# Patient Record
Sex: Female | Born: 1980 | Race: White | Hispanic: No | Marital: Single | Smoking: Current every day smoker
Health system: Southern US, Community
[De-identification: ages and names within clinical notes are randomized; demographics above are authoritative.]

## PROBLEM LIST (undated history)

## (undated) DIAGNOSIS — G40909 Epilepsy, unspecified, not intractable, without status epilepticus: Secondary | ICD-10-CM

## (undated) DIAGNOSIS — F3112 Bipolar disorder, current episode manic without psychotic features, moderate: Secondary | ICD-10-CM

## (undated) DIAGNOSIS — F419 Anxiety disorder, unspecified: Secondary | ICD-10-CM

## (undated) DIAGNOSIS — H4710 Unspecified papilledema: Secondary | ICD-10-CM

## (undated) DIAGNOSIS — C55 Malignant neoplasm of uterus, part unspecified: Secondary | ICD-10-CM

## (undated) DIAGNOSIS — G5600 Carpal tunnel syndrome, unspecified upper limb: Secondary | ICD-10-CM

## (undated) HISTORY — PX: HAND SURGERY: SHX662

## (undated) HISTORY — PX: CHOLECYSTECTOMY: SHX55

## (undated) HISTORY — PX: ABDOMINAL HYSTERECTOMY: SHX81

## (undated) HISTORY — PX: HERNIA REPAIR: SHX51

---

## 2014-01-25 ENCOUNTER — Emergency Department: Payer: Self-pay | Admitting: Emergency Medicine

## 2014-01-25 LAB — URINALYSIS, COMPLETE
Bilirubin,UR: NEGATIVE
Blood: NEGATIVE
Glucose,UR: NEGATIVE mg/dL (ref 0–75)
Ketone: NEGATIVE
Leukocyte Esterase: NEGATIVE
NITRITE: NEGATIVE
PH: 7 (ref 4.5–8.0)
PROTEIN: NEGATIVE
RBC,UR: 3 /HPF (ref 0–5)
SPECIFIC GRAVITY: 1.02 (ref 1.003–1.030)
Squamous Epithelial: <1
WBC UR: 1 /HPF (ref 0–5)

## 2014-01-25 LAB — CBC
HCT: 40.4 % (ref 35.0–47.0)
HGB: 13.6 g/dL (ref 12.0–16.0)
MCH: 29.1 pg (ref 26.0–34.0)
MCHC: 33.7 g/dL (ref 32.0–36.0)
MCV: 86 fL (ref 80–100)
PLATELETS: 321 10*3/uL (ref 150–440)
RBC: 4.67 10*6/uL (ref 3.80–5.20)
RDW: 14.2 % (ref 11.5–14.5)
WBC: 7.6 10*3/uL (ref 3.6–11.0)

## 2014-01-25 LAB — COMPREHENSIVE METABOLIC PANEL
ALBUMIN: 3.4 g/dL (ref 3.4–5.0)
ALT: 41 U/L
ANION GAP: 6 — AB (ref 7–16)
Alkaline Phosphatase: 61 U/L
BUN: 13 mg/dL (ref 7–18)
Bilirubin,Total: 0.2 mg/dL (ref 0.2–1.0)
CHLORIDE: 107 mmol/L (ref 98–107)
CO2: 29 mmol/L (ref 21–32)
Calcium, Total: 8 mg/dL — ABNORMAL LOW (ref 8.5–10.1)
Creatinine: 0.86 mg/dL (ref 0.60–1.30)
Glucose: 104 mg/dL — ABNORMAL HIGH (ref 65–99)
OSMOLALITY: 284 (ref 275–301)
POTASSIUM: 3.7 mmol/L (ref 3.5–5.1)
SGOT(AST): 30 U/L (ref 15–37)
SODIUM: 142 mmol/L (ref 136–145)
TOTAL PROTEIN: 6.7 g/dL (ref 6.4–8.2)

## 2014-03-03 ENCOUNTER — Ambulatory Visit: Payer: Self-pay | Admitting: Ophthalmology

## 2014-03-19 ENCOUNTER — Ambulatory Visit: Payer: Self-pay | Admitting: Neurology

## 2014-07-03 ENCOUNTER — Inpatient Hospital Stay
Admit: 2014-07-03 | Disposition: A | Discharge status: Home / Self Care | Payer: Self-pay | Attending: Psychiatry | Admitting: Psychiatry

## 2014-07-03 LAB — COMPREHENSIVE METABOLIC PANEL
ALBUMIN: 4.3 g/dL
ALK PHOS: 61 U/L
ANION GAP: 5 — AB (ref 7–16)
BILIRUBIN TOTAL: 0.4 mg/dL
BUN: 9 mg/dL
CALCIUM: 8.8 mg/dL — AB
CHLORIDE: 106 mmol/L
Co2: 27 mmol/L
Creatinine: 0.71 mg/dL
EGFR (Non-African Amer.): >60
Glucose: 94 mg/dL
Potassium: 3.9 mmol/L
SGOT(AST): 24 U/L
SGPT (ALT): 29 U/L
Sodium: 138 mmol/L
TOTAL PROTEIN: 7.2 g/dL

## 2014-07-03 LAB — CBC
HCT: 42.6 % (ref 35.0–47.0)
HGB: 14.3 g/dL (ref 12.0–16.0)
MCH: 28.6 pg (ref 26.0–34.0)
MCHC: 33.7 g/dL (ref 32.0–36.0)
MCV: 85 fL (ref 80–100)
PLATELETS: 303 10*3/uL (ref 150–440)
RBC: 5.01 10*6/uL (ref 3.80–5.20)
RDW: 13.8 % (ref 11.5–14.5)
WBC: 8 10*3/uL (ref 3.6–11.0)

## 2014-07-03 LAB — URINALYSIS, COMPLETE
Bilirubin,UR: NEGATIVE
Glucose,UR: NEGATIVE mg/dL (ref 0–75)
KETONE: NEGATIVE
Leukocyte Esterase: NEGATIVE
Nitrite: NEGATIVE
PH: 7 (ref 4.5–8.0)
Protein: NEGATIVE
Specific Gravity: 1.005 (ref 1.003–1.030)

## 2014-07-03 LAB — DRUG SCREEN, URINE
Amphetamines, Ur Screen: NEGATIVE
Barbiturates, Ur Screen: NEGATIVE
Benzodiazepine, Ur Scrn: POSITIVE
COCAINE METABOLITE, UR ~~LOC~~: NEGATIVE
Cannabinoid 50 Ng, Ur ~~LOC~~: NEGATIVE
MDMA (ECSTASY) UR SCREEN: NEGATIVE
Methadone, Ur Screen: NEGATIVE
OPIATE, UR SCREEN: NEGATIVE
PHENCYCLIDINE (PCP) UR S: NEGATIVE
TRICYCLIC, UR SCREEN: NEGATIVE

## 2014-07-03 LAB — SALICYLATE LEVEL: Salicylates, Serum: <4 mg/dL

## 2014-07-03 LAB — ETHANOL: Ethanol: <5 mg/dL

## 2014-07-03 LAB — ACETAMINOPHEN LEVEL

## 2014-07-03 LAB — TSH: Thyroid Stimulating Horm: 2.421 u[IU]/mL
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
## 2015-08-02 ENCOUNTER — Emergency Department: Payer: Medicaid Other

## 2015-08-02 ENCOUNTER — Emergency Department
Admission: EM | Admit: 2015-08-02 | Discharge: 2015-08-02 | Disposition: A | Discharge status: Home / Self Care | Payer: Medicaid Other | Attending: Emergency Medicine | Admitting: Emergency Medicine

## 2015-08-02 DIAGNOSIS — M545 Low back pain: Secondary | ICD-10-CM | POA: Insufficient documentation

## 2015-08-02 DIAGNOSIS — S300XXA Contusion of lower back and pelvis, initial encounter: Secondary | ICD-10-CM | POA: Diagnosis not present

## 2015-08-02 DIAGNOSIS — Y999 Unspecified external cause status: Secondary | ICD-10-CM | POA: Insufficient documentation

## 2015-08-02 DIAGNOSIS — Y929 Unspecified place or not applicable: Secondary | ICD-10-CM | POA: Diagnosis not present

## 2015-08-02 DIAGNOSIS — W108XXA Fall (on) (from) other stairs and steps, initial encounter: Secondary | ICD-10-CM | POA: Diagnosis not present

## 2015-08-02 DIAGNOSIS — Z5321 Procedure and treatment not carried out due to patient leaving prior to being seen by health care provider: Secondary | ICD-10-CM | POA: Insufficient documentation

## 2015-08-02 DIAGNOSIS — Y939 Activity, unspecified: Secondary | ICD-10-CM | POA: Insufficient documentation

## 2015-08-02 MED ORDER — CYCLOBENZAPRINE HCL 10 MG PO TABS: 10.0000 mg | ORAL_TABLET | Freq: Three times a day (TID) | ORAL | Status: DC | PRN

## 2015-08-02 MED ORDER — OXYCODONE-ACETAMINOPHEN 5-325 MG PO TABS
1.0000 | ORAL_TABLET | Freq: Once | ORAL | Status: AC
Start: 2015-08-02 — End: 2015-08-02
  Administered 2015-08-02: 1 via ORAL
  Filled 2015-08-02: qty 1

## 2015-08-02 MED ORDER — IBUPROFEN 800 MG PO TABS: 800.0000 mg | ORAL_TABLET | Freq: Three times a day (TID) | ORAL | Status: DC | PRN

## 2015-08-02 MED ORDER — DIAZEPAM 5 MG/ML IJ SOLN
5.0000 mg | Freq: Once | INTRAMUSCULAR | Status: AC
  Administered 2015-08-02: 5 mg via INTRAMUSCULAR
  Filled 2015-08-02: qty 2

## 2015-08-02 MED ORDER — KETOROLAC TROMETHAMINE 60 MG/2ML IM SOLN
60.0000 mg | Freq: Once | INTRAMUSCULAR | Status: AC
  Administered 2015-08-02: 60 mg via INTRAMUSCULAR
  Filled 2015-08-02: qty 2

## 2015-08-02 MED ORDER — OXYCODONE-ACETAMINOPHEN 5-325 MG PO TABS
1.0000 | ORAL_TABLET | Freq: Once | ORAL | Status: AC
  Administered 2015-08-02: 1 via ORAL

## 2015-08-02 MED ORDER — OXYCODONE-ACETAMINOPHEN 5-325 MG PO TABS
ORAL_TABLET | ORAL | Status: AC
  Filled 2015-08-02: qty 1

## 2015-08-02 NOTE — ED Provider Notes (Signed)
[facility] Emergency Department Provider Note  ____________________________________________  Time seen: Approximately 10:23 AM  I have reviewed the triage vital signs and the nursing notes.   HISTORY  Chief Complaint Back Pain    HPI Paige Carr is a 35 y.o. female who presents for evaluation of low back pain. Patient states that she was her dog out when she slipped on a stone step and landed on her back. Complains of pain in her lower back nonradiating. About 8/10 at this time. Not taking medications over-the-counter. Patient unable to get comfortable. Head to have her neighbor bring her in. Planes of severe pain to her coccyx and tailbone as well as left hip.   No past medical history on file.  There are no active problems to display for this patient.   No past surgical history on file.  Current Outpatient Rx  Name  Route  Sig  Dispense  Refill  . cyclobenzaprine (FLEXERIL) 10 MG tablet   Oral   Take 1 tablet (10 mg total) by mouth every 8 (eight) hours as needed for muscle spasms.   30 tablet   1   . ibuprofen (ADVIL,MOTRIN) 800 MG tablet   Oral   Take 1 tablet (800 mg total) by mouth every 8 (eight) hours as needed.   30 tablet   0     Allergies Review of patient's allergies indicates no known allergies.  No family history on file.  Social History Social History  Substance Use Topics  . Smoking status: Not on file  . Smokeless tobacco: Not on file  . Alcohol Use: Not on file    Review of Systems Constitutional: No fever/chills Eyes: No visual changes. ENT: No sore throat. Cardiovascular: Denies chest pain. Respiratory: Denies shortness of breath. Gastrointestinal: No abdominal pain.  No nausea, no vomiting.  No diarrhea.  No constipation. Genitourinary: Negative for dysuria. Musculoskeletal: Positive for coccyx, sacrum, lumbar spine tenderness, positive for left hip tenderness. Skin: Negative for rash. Neurological:  Negative for headaches, focal weakness or numbness.  10-point ROS otherwise negative.  ____________________________________________   PHYSICAL EXAM:   BP 122/68 mmHg  Pulse 92  Resp 17  SpO2 100%   ED Triage Vitals  Enc Vitals Group     BP --      Pulse --      Resp --      Temp --      Temp src --      SpO2 --      Weight --      Height --      Head Cir --      Peak Flow --      Pain Score --      Pain Loc --      Pain Edu? --      Excl. in [place]? --     Constitutional: Alert and oriented. Well appearing and in Mild distress. Patient is laying flat on her stomach unable to rotate or turn. Exam performed while the patient is in a prone position. Cardiovascular: Normal rate, regular rhythm. Grossly normal heart sounds.  Good peripheral circulation. Respiratory: Normal respiratory effort.  No retractions. Lungs CTAB. Musculoskeletal: Examination limited to his lumbar coccyx sacrum tenderness point tenderness no ecchymosis or bruising noted. Positive for left hip and pelvis tenderness. Neurologic:  Normal speech and language. No gross focal neurologic deficits are appreciated. No gait instability. Skin:  Skin is warm, dry and intact. No rash noted. Psychiatric: Mood and  affect are normal. Speech and behavior are normal.  ____________________________________________   LABS (all labs ordered are listed, but only abnormal results are displayed)  Labs Reviewed - No data to display  RADIOLOGY   ____________________________________________   PROCEDURES  Procedure(s) performed: None  Critical Care performed: No  ____________________________________________   INITIAL IMPRESSION / ASSESSMENT AND PLAN / ED COURSE  Pertinent labs & imaging results that were available during my care of the patient were reviewed by me and considered in my medical decision making (see chart for details).  Status post fall with acute lumbar sacral contusion. Coccyx contusion and head  contusion. Reassurance provided to the patient Rx given for ibuprofen 800 and Flexeril 10 mg. Patient follow-up PCP or return to ER with any worsening symptomology. ____________________________________________   FINAL CLINICAL IMPRESSION(S) / ED DIAGNOSES  Final diagnoses:  Lumbar contusion, initial encounter  Contusion of sacrum, initial encounter     This chart was dictated using voice recognition software/Dragon. Despite best efforts to proofread, errors can occur which can change the meaning. Any change was purely unintentional.   Arlyss Repress, PA-C 08/02/15 [id number]  Lisa Roca, MD 08/02/15 [phone]

## 2015-08-02 NOTE — ED Notes (Signed)
See downtime note 

## 2015-08-02 NOTE — ED Notes (Signed)
Reports she was taking dogs out and slipped on a stone step and when she landed on her back she landed on the stone.  Patient complains of lower back pain.

## 2015-08-02 NOTE — Discharge Instructions (Signed)
Contusion A contusion is a deep bruise. Contusions are the result of a blunt injury to tissues and muscle fibers under the skin. The injury causes bleeding under the skin. The skin overlying the contusion may turn blue, purple, or yellow. Minor injuries will give you a painless contusion, but more severe contusions may stay painful and swollen for a few weeks.  CAUSES  This condition is usually caused by a blow, trauma, or direct force to an area of the body. SYMPTOMS  Symptoms of this condition include:  Swelling of the injured area.  Pain and tenderness in the injured area.  Discoloration. The area may have redness and then turn blue, purple, or yellow. DIAGNOSIS  This condition is diagnosed based on a physical exam and medical history. An X-ray, CT scan, or MRI may be needed to determine if there are any associated injuries, such as broken bones (fractures). TREATMENT  Specific treatment for this condition depends on what area of the body was injured. In general, the best treatment for a contusion is resting, icing, applying pressure to (compression), and elevating the injured area. This is often called the RICE strategy. Over-the-counter anti-inflammatory medicines may also be recommended for pain control.  HOME CARE INSTRUCTIONS   Rest the injured area.  If directed, apply ice to the injured area:  Put ice in a plastic bag.  Place a towel between your skin and the bag.  Leave the ice on for 20 minutes, 2-3 times per day.  If directed, apply light compression to the injured area using an elastic bandage. Make sure the bandage is not wrapped too tightly. Remove and reapply the bandage as directed by your health care provider.  If possible, raise (elevate) the injured area above the level of your heart while you are sitting or lying down.  Take over-the-counter and prescription medicines only as told by your health care provider. SEEK MEDICAL CARE IF:  Your symptoms do not  improve after several days of treatment.  Your symptoms get worse.  You have difficulty moving the injured area. SEEK IMMEDIATE MEDICAL CARE IF:   You have severe pain.  You have numbness in a hand or foot.  Your hand or foot turns pale or cold.   This information is not intended to replace advice given to you by your health care provider. Make sure you discuss any questions you have with your health care provider.   Document Released: 12/07/2004 Document Revised: 11/18/2014 Document Reviewed: 07/15/2014 Elsevier Interactive Patient Education 2016 [place] Injury The tailbone (coccyx) is the small bone at the lower end of the spine. A tailbone injury may involve stretched ligaments, bruising, or a broken bone (fracture). Tailbone injuries can be painful, and some may take a long time to heal. CAUSES This condition is often caused by falling and landing on the tailbone. Other causes include:  Repeated strain or friction from actions such as rowing and bicycling.  Childbirth. In some cases, the cause may not be known. RISK FACTORS This condition is more common in women than in men. SYMPTOMS Symptoms of this condition include:  Pain in the lower back, especially when sitting.  Pain or difficulty when standing up from a sitting position.  Bruising in the tailbone area.  Painful bowel movements.  In women, pain during intercourse. DIAGNOSIS This condition may be diagnosed based on your symptoms and a physical exam. X-rays may be taken if a fracture is suspected. You may also have other tests, such as  a CT scan or MRI. TREATMENT This condition may be treated with medicines to help relieve your pain. Most tailbone injuries heal on their own in 4-6 weeks. However, recovery time may be longer if the injury involves a fracture. HOME CARE INSTRUCTIONS  Take medicines only as directed by your health care provider.  If directed, apply ice to the injured  area:  Put ice in a plastic bag.  Place a towel between your skin and the bag.  Leave the ice on for 20 minutes, 2-3 times per day for the first 1-2 days.  Sit on a large, rubber or inflated ring or cushion to ease your pain. Lean forward when you are sitting to help decrease discomfort.  Avoid sitting for long periods of time.  Increase your activity as the pain allows. Perform any exercises that are recommended by your health care provider or physical therapist.  If you have pain during bowel movements, use stool softeners as directed by your health care provider.  Eat a diet that includes plenty of fiber to help prevent constipation.  Keep all follow-up visits as directed by your health care provider. This is important. PREVENTION Wear appropriate padding and sports gear when bicycling and rowing. This can help to prevent developing an injury that is caused by repeated strain or friction. SEEK MEDICAL CARE IF:  Your pain becomes worse.  Your bowel movements cause a great deal of discomfort.  You are unable to have a bowel movement.  You have uncontrolled urine loss (urinary incontinence).  You have a fever.   This information is not intended to replace advice given to you by your health care provider. Make sure you discuss any questions you have with your health care provider.   Document Released: 02/25/2000 Document Revised: 07/14/2014 Document Reviewed: 02/23/2014 Elsevier Interactive Patient Education [employer].

## 2015-08-02 NOTE — ED Notes (Signed)
States she fell couple of days ago  States pain is worse this am   Unable to bear wt  States pain is radiating into both legs

## 2015-08-02 NOTE — ED Notes (Signed)
See  Down time triage note

## 2015-08-02 NOTE — ED Notes (Signed)
No answer when called for treatment room.  ?

## 2015-08-16 ENCOUNTER — Emergency Department
Admission: EM | Admit: 2015-08-16 | Discharge: 2015-08-17 | Disposition: A | Discharge status: Home / Self Care | Payer: Medicaid Other | Attending: Emergency Medicine | Admitting: Emergency Medicine

## 2015-08-16 ENCOUNTER — Encounter: Payer: Self-pay | Admitting: Emergency Medicine

## 2015-08-16 DIAGNOSIS — Z91040 Latex allergy status: Secondary | ICD-10-CM | POA: Diagnosis not present

## 2015-08-16 DIAGNOSIS — Z791 Long term (current) use of non-steroidal anti-inflammatories (NSAID): Secondary | ICD-10-CM | POA: Insufficient documentation

## 2015-08-16 DIAGNOSIS — R45851 Suicidal ideations: Secondary | ICD-10-CM

## 2015-08-16 DIAGNOSIS — F449 Dissociative and conversion disorder, unspecified: Secondary | ICD-10-CM

## 2015-08-16 DIAGNOSIS — F319 Bipolar disorder, unspecified: Secondary | ICD-10-CM

## 2015-08-16 DIAGNOSIS — F23 Brief psychotic disorder: Secondary | ICD-10-CM | POA: Diagnosis not present

## 2015-08-16 DIAGNOSIS — F3112 Bipolar disorder, current episode manic without psychotic features, moderate: Secondary | ICD-10-CM | POA: Insufficient documentation

## 2015-08-16 DIAGNOSIS — Z046 Encounter for general psychiatric examination, requested by authority: Secondary | ICD-10-CM

## 2015-08-16 DIAGNOSIS — F17200 Nicotine dependence, unspecified, uncomplicated: Secondary | ICD-10-CM | POA: Insufficient documentation

## 2015-08-16 HISTORY — DX: Bipolar disorder, current episode manic without psychotic features, moderate: F31.12

## 2015-08-16 LAB — COMPREHENSIVE METABOLIC PANEL
ALT: 12 U/L — AB (ref 14–54)
ANION GAP: 6 (ref 5–15)
AST: 16 U/L (ref 15–41)
Albumin: 4.1 g/dL (ref 3.5–5.0)
Alkaline Phosphatase: 45 U/L (ref 38–126)
BUN: 8 mg/dL (ref 6–20)
CHLORIDE: 108 mmol/L (ref 101–111)
CO2: 22 mmol/L (ref 22–32)
CREATININE: 0.7 mg/dL (ref 0.44–1.00)
Calcium: 8.5 mg/dL — ABNORMAL LOW (ref 8.9–10.3)
GFR calc non Af Amer: >60 mL/min (ref >60)
GLUCOSE: 92 mg/dL (ref 65–99)
Potassium: 3.6 mmol/L (ref 3.5–5.1)
SODIUM: 136 mmol/L (ref 135–145)
Total Bilirubin: 0.2 mg/dL — ABNORMAL LOW (ref 0.3–1.2)
Total Protein: 6.6 g/dL (ref 6.5–8.1)

## 2015-08-16 LAB — URINE DRUG SCREEN, QUALITATIVE (ARMC ONLY)
Amphetamines, Ur Screen: NOT DETECTED
Barbiturates, Ur Screen: NOT DETECTED
Benzodiazepine, Ur Scrn: POSITIVE — AB
COCAINE METABOLITE, UR ~~LOC~~: NOT DETECTED
Cannabinoid 50 Ng, Ur ~~LOC~~: NOT DETECTED
MDMA (ECSTASY) UR SCREEN: NOT DETECTED
Methadone Scn, Ur: NOT DETECTED
Opiate, Ur Screen: NOT DETECTED
Phencyclidine (PCP) Ur S: NOT DETECTED
TRICYCLIC, UR SCREEN: NOT DETECTED

## 2015-08-16 LAB — CBC
HCT: 39.2 % (ref 35.0–47.0)
HEMOGLOBIN: 13.3 g/dL (ref 12.0–16.0)
MCH: 28.5 pg (ref 26.0–34.0)
MCHC: 33.8 g/dL (ref 32.0–36.0)
MCV: 84.2 fL (ref 80.0–100.0)
Platelets: 262 10*3/uL (ref 150–440)
RBC: 4.66 MIL/uL (ref 3.80–5.20)
RDW: 14 % (ref 11.5–14.5)
WBC: 10.2 10*3/uL (ref 3.6–11.0)

## 2015-08-16 LAB — POCT PREGNANCY, URINE: Preg Test, Ur: NEGATIVE
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
## 2015-08-17 DIAGNOSIS — Z046 Encounter for general psychiatric examination, requested by authority: Secondary | ICD-10-CM

## 2015-08-17 DIAGNOSIS — F23 Brief psychotic disorder: Secondary | ICD-10-CM | POA: Diagnosis not present

## 2015-08-17 DIAGNOSIS — R45851 Suicidal ideations: Secondary | ICD-10-CM

## 2015-08-17 DIAGNOSIS — F319 Bipolar disorder, unspecified: Secondary | ICD-10-CM

## 2015-08-17 DIAGNOSIS — F449 Dissociative and conversion disorder, unspecified: Secondary | ICD-10-CM

## 2015-08-17 LAB — SALICYLATE LEVEL

## 2015-08-17 LAB — ACETAMINOPHEN LEVEL

## 2015-08-17 LAB — ETHANOL: Alcohol, Ethyl (B): <5 mg/dL (ref <5)

## 2015-08-17 NOTE — ED Notes (Signed)
ED BHU PLACEMENT JUSTIFICATION Is the patient under IVC or is there intent for IVC: Yes.   Is the patient medically cleared: Yes.   Is there vacancy in the ED BHU: Yes.   Is the population mix appropriate for patient: Yes.   Is the patient awaiting placement in inpatient or outpatient setting: No. Has the patient had a psychiatric consult: No. Survey of unit performed for contraband, proper placement and condition of furniture, tampering with fixtures in bathroom, shower, and each patient room: Yes.  ; Findings: NA APPEARANCE/BEHAVIOR calm, cooperative and adequate rapport can be established NEURO ASSESSMENT Orientation: time, place and person Hallucinations: No.None noted (Hallucinations) Speech: Normal Gait: normal RESPIRATORY ASSESSMENT Normal expansion.  Clear to auscultation.  No rales, rhonchi, or wheezing. CARDIOVASCULAR ASSESSMENT regular rate and rhythm, S1, S2 normal, no murmur, click, rub or gallop GASTROINTESTINAL ASSESSMENT soft, nontender, BS WNL, no r/g EXTREMITIES normal strength, tone, and muscle mass PLAN OF CARE Provide calm/safe environment. Vital signs assessed twice daily. ED BHU Assessment once each 12-hour shift. Collaborate with intake RN daily or as condition indicates. Assure the ED provider has rounded once each shift. Provide and encourage hygiene. Provide redirection as needed. Assess for escalating behavior; address immediately and inform ED provider.  Assess family dynamic and appropriateness for visitation as needed: Yes.  ; If necessary, describe findings: NA Educate the patient/family about BHU procedures/visitation: Yes.  ; If necessary, describe findings: NA  

## 2015-08-17 NOTE — ED Provider Notes (Signed)
[facility] Emergency Department Provider Note   ____________________________________________  Time seen: Approximately 2325 PM  I have reviewed the triage vital signs and the nursing notes.   HISTORY  Chief Complaint Suicidal    HPI Paige Carr is a 35 y.o. female who comes into the hospital today brought in under involuntary commitment with the police. According to the police they received a call for patient being suicidal. They report that when they arrived the husband was restraining the patient on the floor and she was kicking with the intention of escaping to get a knife to hurt herself. According to the police the patient was hysterical saying that she wanted to be her father who was in heaven. She did receive Haldol by EMS to help calm her down. The patient does have a history of bipolar disorder and according to family this episode is worse than it typically is. They do not know of any triggers but the patient says that this been a lot building up. The patient reports she takes her medicine on a regular basis but her husband is doubtful. The patient has a history of suicide attempts in the past with overdose being the most recent. The patient's father also committed suicide within the past 5 years. The patient reports that she does not remember what happened today and she is currently denying suicidal ideation. The patient reports she's had some auditory as [place] telling her that she is not good enough and that she should die. The patient is here for evaluation.   Past Medical History  Diagnosis Date  . Bipolar 1 disorder with moderate mania (HCC)     There are no active problems to display for this patient.   Past Surgical History  Procedure Laterality Date  . Cholecystectomy    . Hernia repair      Current Outpatient Rx  Name  Route  Sig  Dispense  Refill  . cyclobenzaprine (FLEXERIL) 10 MG tablet   Oral   Take 1 tablet (10 mg total) by  mouth every 8 (eight) hours as needed for muscle spasms.   30 tablet   1   . ibuprofen (ADVIL,MOTRIN) 800 MG tablet   Oral   Take 1 tablet (800 mg total) by mouth every 8 (eight) hours as needed.   30 tablet   0     Allergies Latex  No family history on file.  Social History Social History  Substance Use Topics  . Smoking status: Current Every Day Smoker  . Smokeless tobacco: None  . Alcohol Use: No    Review of Systems Constitutional: No fever/chills Eyes: No visual changes. ENT: No sore throat. Cardiovascular: Denies chest pain. Respiratory: Denies shortness of breath. Gastrointestinal: No abdominal pain.  No nausea, no vomiting.  No diarrhea.  No constipation. Genitourinary: Negative for dysuria. Musculoskeletal: Negative for back pain. Skin: Negative for rash. Neurological: Negative for headaches, focal weakness or numbness. Psych: Suicidal ideation  10-point ROS otherwise negative.  ____________________________________________   PHYSICAL EXAM:  VITAL SIGNS: ED Triage Vitals  Enc Vitals Group     BP 08/16/15 2324 108/64 mmHg     Pulse Rate 08/16/15 2324 90     Resp 08/16/15 2324 14     Temp 08/16/15 2324 98.1 F (36.7 C)     Temp Source 08/16/15 2324 Oral     SpO2 08/16/15 2324 95 %     Weight 08/16/15 2324 165 lb (74.844 kg)     Height 08/16/15  2324 5\' 3"  (1.6 m)     Head Cir --      Peak Flow --      Pain Score 08/16/15 2325 7     Pain Loc --      Pain Edu? --      Excl. in [place]? --     Constitutional: Alert and oriented. Well appearing and in no acute distress. Eyes: Conjunctivae are normal. PERRL. EOMI. Head: Atraumatic. Nose: No congestion/rhinnorhea. Mouth/Throat: Mucous membranes are moist.  Oropharynx non-erythematous. Cardiovascular: Normal rate, regular rhythm. Grossly normal heart sounds.  Good peripheral circulation. Respiratory: Normal respiratory effort.  No retractions. Lungs CTAB. Gastrointestinal: Soft and nontender. No  distention. Positive bowel sounds Musculoskeletal: No lower extremity tenderness nor edema.   Neurologic:  Normal speech and language.  Skin:  Skin is warm, dry and intact. Psychiatric: Mood and affect are normal.   ____________________________________________   LABS (all labs ordered are listed, but only abnormal results are displayed)  Labs Reviewed  COMPREHENSIVE METABOLIC PANEL - Abnormal; Notable for the following:    Calcium 8.5 (*)    ALT 12 (*)    Total Bilirubin 0.2 (*)    All other components within normal limits  ACETAMINOPHEN LEVEL - Abnormal; Notable for the following:    Acetaminophen (Tylenol), Serum <10 (*)    All other components within normal limits  URINE DRUG SCREEN, QUALITATIVE ([facility] ONLY) - Abnormal; Notable for the following:    Benzodiazepine, Ur Scrn POSITIVE (*)    All other components within normal limits  ETHANOL  SALICYLATE LEVEL  CBC  POC URINE PREG, ED  POCT PREGNANCY, URINE   ____________________________________________  EKG  None ____________________________________________  RADIOLOGY  None ____________________________________________   PROCEDURES  Procedure(s) performed: None  Critical Care performed: No  ____________________________________________   INITIAL IMPRESSION / ASSESSMENT AND PLAN / ED COURSE  Pertinent labs & imaging results that were available during my care of the patient were reviewed by me and considered in my medical decision making (see chart for details).  This is a 35 year old female who comes into the hospital today with suicidal ideation. The patient will be seen by psych. ____________________________________________   FINAL CLINICAL IMPRESSION(S) / ED DIAGNOSES  Final diagnoses:  Suicidal ideation      NEW MEDICATIONS STARTED DURING THIS VISIT:  New Prescriptions   No medications on file     Note:  This document was prepared using Dragon voice recognition software and may include  unintentional dictation errors.    Loney Hering, MD 08/17/15 [phone]

## 2015-08-17 NOTE — Consult Note (Signed)
[facility] Consult   Reason for Consult:  Consult for 35 year old woman brought in after becoming agitated and potentially suicidal at home. She was on IVC. Referring Physician:  Reita Cliche Patient Identification: Paige Carr MRN:  [id number] Principal Diagnosis: Brief reactive psychosis Diagnosis:   Patient Active Problem List   Diagnosis Date Noted  . 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 Bipolar disorder ([place]) [F31.9] 08/17/2015  . Suicidal ideation [R45.851] 08/17/2015  . Involuntary commitment [Z04.6] 08/17/2015    Total Time spent with patient: 1 hour  Subjective:   Paige Carr is a 35 y.o. female patient admitted with "I had a psychotic breakdown".  HPI:  Patient interviewed. Chart reviewed. Labs and vitals reviewed. 35 year old woman remembers last night going to bed. Some time after that apparently she got up and she has a vague memory of walking towards the kitchen with a thought of picking up a knife. Evidently she actually got a knife and was trying to hurt herself. Her husband restrained her. When she was picked up by police she was described as still being very agitated. Patient this morning on interview is completely calm. She does say that she has been under a lot of emotional stress recently. Most acutely she had a stressful situation, up yesterday. She has a brother who is chronically mentally ill who every now and then will come back into her life with manipulative behavior. Apparently that happened yesterday and there was a big upper or over it. Patient also feels like she gets a lot of stress from her family at home. Also it feels stressed out by raising 4 children at home. Baseline she feels like she does have some chronic anxiety. She says that she goes to therapy a couple times a week. He is currently on prescription psychiatric medicine and sees an outpatient psychiatrist. Patient does not describe what  sounds like a clear mania over the last few days. There were no waking psychotic symptoms. There was no waking abnormal behavior that she is describing. She says that last night because she was trying to get to sleep she took her Xanax and Percocet but she claims that this is a pretty typical thing for her to do to take the full dose of Xanax and Percocet at the same time. She denies that she was using any other drugs and denies that she was taking alcohol. Today the patient denies having any suicidal thoughts or wishes at all. Denies homicidal ideation. She is not having any current psychotic symptoms.  Social history: Lives with her husband. Therefore young children at home 2 of them hers to them his by previous relationship. Patient not working outside the home. Feels like it's an altogether stressful situation. Also stays in close contact with her mother and her brother who she says is chronically mentally ill and occasionally manipulative such as yesterday. Talking with him brings on a lot of guilt and emotional distress.  Medical history: Patient has a history of a seizure disorder. She is taking Keppra from her description of it. She says she is been on it for quite a while and she has not had a seizure that she knows of in 5 years. The seizure she does describe do not sound like grand mal seizures. No other ongoing medical problems.  Substance abuse history: Patient does not drink. Does not abuse drugs. Denies that she abuses her prescription medicine.    Past Psychiatric History: Patient has a history  of mood symptoms. She has been diagnosed as bipolar. She is currently taking Latuda 60 mg a day and takes Xanax. Sounds like the dose is a half milligram one to 3 of them a day as needed. 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 Sees a therapist here more locally on a regular basis. She does have a prior history of a suicide attempt overdose of Ambien in 2013.  Risk to Self: Suicidal Ideation: No Suicidal  Intent: No Is patient at risk for suicide?: No Suicidal Plan?: No Access to Means: No What has been your use of drugs/alcohol within the last 12 months?: denied use How many times?: 10 ("A lot of times") Other Self Harm Risks: denied Triggers for Past Attempts: Unpredictable Intentional Self Injurious Behavior: None Risk to Others: Homicidal Ideation: No Thoughts of Harm to Others: No Current Homicidal Intent: No Current Homicidal Plan: No Access to Homicidal Means: No Identified Victim: denied History of harm to others?: No Assessment of Violence: None Noted Violent Behavior Description: denied Does patient have access to weapons?: No Criminal Charges Pending?: No Does patient have a court date: No Prior Inpatient Therapy: Prior Inpatient Therapy: Yes Prior Therapy Dates: 2016 Prior Therapy Facilty/Provider(s): [facility] Reason for Treatment: Bipolar Disorder Prior Outpatient Therapy: Prior Outpatient Therapy: Yes Prior Therapy Dates: Current Prior Therapy Facilty/Provider(s): [facility] Reason for Treatment: Bipolar Disorder Does patient have an ACCT team?: No Does patient have Intensive In-House Services?  : No Does patient have [facility] services? : No Does patient have P4CC services?: No  Past Medical History:  Past Medical History  Diagnosis Date  . Bipolar 1 disorder with moderate mania ([place])     Past Surgical History  Procedure Laterality Date  . Cholecystectomy    . Hernia repair     Family History: No family history on file. Family Psychiatric  History: Brother is described as also having bipolar disorder sounds like a pretty difficult person. The patient's father committed suicide. Social History:  History  Alcohol Use No     History  Drug Use No    Social History   Social History  . Marital Status: Single    Spouse Name: N/A  . Number of Children: N/A  . Years of Education: N/A   Social History Main Topics  . Smoking status: Current Every Day Smoker   . Smokeless tobacco: None  . Alcohol Use: No  . Drug Use: No  . Sexual Activity: Not Asked   Other Topics Concern  . None   Social History Narrative  . None   Additional Social History:    Allergies:   Allergies  Allergen Reactions  . Latex Hives    Labs:  Results for orders placed or performed during the hospital encounter of 08/16/15 (from the past 48 hour(s))  Comprehensive metabolic panel     Status: Abnormal   Collection Time: 08/16/15 11:24 PM  Result Value Ref Range   Sodium 136 135 - 145 mmol/L   Potassium 3.6 3.5 - 5.1 mmol/L   Chloride 108 101 - 111 mmol/L   CO2 22 22 - 32 mmol/L   Glucose, Bld 92 65 - 99 mg/dL   BUN 8 6 - 20 mg/dL   Creatinine, Ser 0.70 0.44 - 1.00 mg/dL   Calcium 8.5 (L) 8.9 - 10.3 mg/dL   Total Protein 6.6 6.5 - 8.1 g/dL   Albumin 4.1 3.5 - 5.0 g/dL   AST 16 15 - 41 U/L   ALT 12 (L) 14 -  54 U/L   Alkaline Phosphatase 45 38 - 126 U/L   Total Bilirubin 0.2 (L) 0.3 - 1.2 mg/dL   GFR calc non Af Amer >60 >60 mL/min   GFR calc Af Amer >60 >60 mL/min    Comment: (NOTE) The eGFR has been calculated using the CKD EPI equation. This calculation has not been validated in all clinical situations. eGFR's persistently <60 mL/min signify possible Chronic Kidney Disease.    Anion gap 6 5 - 15  Ethanol     Status: None   Collection Time: 08/16/15 11:24 PM  Result Value Ref Range   Alcohol, Ethyl (B) <5 <5 mg/dL    Comment:        LOWEST DETECTABLE LIMIT FOR SERUM ALCOHOL IS 5 mg/dL FOR MEDICAL PURPOSES ONLY   Salicylate level     Status: None   Collection Time: 08/16/15 11:24 PM  Result Value Ref Range   Salicylate Lvl <7.0 2.8 - 30.0 mg/dL  Acetaminophen level     Status: Abnormal   Collection Time: 08/16/15 11:24 PM  Result Value Ref Range   Acetaminophen (Tylenol), Serum <10 (L) 10 - 30 ug/mL    Comment:        THERAPEUTIC CONCENTRATIONS VARY SIGNIFICANTLY. A RANGE OF 10-30 ug/mL MAY BE AN EFFECTIVE CONCENTRATION FOR MANY  PATIENTS. HOWEVER, SOME ARE BEST TREATED AT CONCENTRATIONS OUTSIDE THIS RANGE. ACETAMINOPHEN CONCENTRATIONS >150 ug/mL AT 4 HOURS AFTER INGESTION AND >50 ug/mL AT 12 HOURS AFTER INGESTION ARE OFTEN ASSOCIATED WITH TOXIC REACTIONS.   cbc     Status: None   Collection Time: 08/16/15 11:24 PM  Result Value Ref Range   WBC 10.2 3.6 - 11.0 K/uL   RBC 4.66 3.80 - 5.20 MIL/uL   Hemoglobin 13.3 12.0 - 16.0 g/dL   HCT 39.2 35.0 - 47.0 %   MCV 84.2 80.0 - 100.0 fL   MCH 28.5 26.0 - 34.0 pg   MCHC 33.8 32.0 - 36.0 g/dL   RDW 14.0 11.5 - 14.5 %   Platelets 262 150 - 440 K/uL  Urine Drug Screen, Qualitative     Status: Abnormal   Collection Time: 08/16/15 11:36 PM  Result Value Ref Range   Tricyclic, Ur Screen NONE DETECTED NONE DETECTED   Amphetamines, Ur Screen NONE DETECTED NONE DETECTED   MDMA (Ecstasy)Ur Screen NONE DETECTED NONE DETECTED   Cocaine Metabolite,Ur [place] NONE DETECTED NONE DETECTED   Opiate, Ur Screen NONE DETECTED NONE DETECTED   Phencyclidine (PCP) Ur S NONE DETECTED NONE DETECTED   Cannabinoid 50 Ng, Ur [place] NONE DETECTED NONE DETECTED   Barbiturates, Ur Screen NONE DETECTED NONE DETECTED   Benzodiazepine, Ur Scrn POSITIVE (A) NONE DETECTED   Methadone Scn, Ur NONE DETECTED NONE DETECTED    Comment: (NOTE) 350  Tricyclics, urine               Cutoff 1000 ng/mL 200  Amphetamines, urine             Cutoff 1000 ng/mL 300  MDMA (Ecstasy), urine           Cutoff 500 ng/mL 400  Cocaine Metabolite, urine       Cutoff 300 ng/mL 500  Opiate, urine                   Cutoff 300 ng/mL 600  Phencyclidine (PCP), urine      Cutoff 25 ng/mL 700  Cannabinoid, urine  Cutoff 50 ng/mL 800  Barbiturates, urine             Cutoff 200 ng/mL 900  Benzodiazepine, urine           Cutoff 200 ng/mL 1000 Methadone, urine                Cutoff 300 ng/mL 1100 1200 The urine drug screen provides only a preliminary, unconfirmed 1300 analytical test result and should not be used for  non-medical 1400 purposes. Clinical consideration and professional judgment should 1500 be applied to any positive drug screen result due to possible 1600 interfering substances. A more specific alternate chemical method 1700 must be used in order to obtain a confirmed analytical result.  1800 Gas chromato graphy / mass spectrometry (GC/MS) is the preferred 1900 confirmatory method.   Pregnancy, urine POC     Status: None   Collection Time: 08/16/15 11:38 PM  Result Value Ref Range   Preg Test, Ur NEGATIVE NEGATIVE    Comment:        THE SENSITIVITY OF THIS METHODOLOGY IS >24 mIU/mL     No current facility-administered medications for this encounter.   Current Outpatient Prescriptions  Medication Sig Dispense Refill  . cyclobenzaprine (FLEXERIL) 10 MG tablet Take 1 tablet (10 mg total) by mouth every 8 (eight) hours as needed for muscle spasms. 30 tablet 1  . ibuprofen (ADVIL,MOTRIN) 800 MG tablet Take 1 tablet (800 mg total) by mouth every 8 (eight) hours as needed. 30 tablet 0    Musculoskeletal: Strength & Muscle Tone: within normal limits Gait & Station: normal Patient leans: N/A  Psychiatric Specialty Exam: Physical Exam  Nursing note and vitals reviewed. Constitutional: She appears well-developed and well-nourished.  HENT:  Head: Normocephalic and atraumatic.  Eyes: Conjunctivae are normal. Pupils are equal, round, and reactive to light.  Neck: Normal range of motion.  Cardiovascular: Regular rhythm and normal heart sounds.   Respiratory: Effort normal. No respiratory distress.  GI: Soft.  Musculoskeletal: Normal range of motion.  Neurological: She is alert.  Skin: Skin is warm and dry.  Psychiatric: Her speech is normal and behavior is normal. Judgment and thought content normal. Her mood appears anxious. She exhibits abnormal recent memory.    Review of Systems  Constitutional: Negative.   HENT: Negative.   Eyes: Negative.   Respiratory: Negative.    Cardiovascular: Negative.   Gastrointestinal: Negative.   Musculoskeletal: Negative.   Skin: Negative.   Neurological: Negative.   Psychiatric/Behavioral: Positive for depression and memory loss. Negative for suicidal ideas, hallucinations and substance abuse. The patient is nervous/anxious and has insomnia.     Blood pressure 100/66, pulse 88, temperature 98 F (36.7 C), temperature source Oral, resp. rate 20, height '5\' 3"'$  (1.6 m), weight 74.844 kg (165 lb), SpO2 99 %.Body mass index is 29.24 kg/(m^2).  General Appearance: Fairly Groomed  Eye Contact:  Fair  Speech:  Clear and Coherent  Volume:  Normal  Mood:  Euthymic  Affect:  Appropriate  Thought Process:  Goal Directed  Orientation:  Full (Time, Place, and Person)  Thought Content:  Negative  Suicidal Thoughts:  No  Homicidal Thoughts:  No  Memory:  Immediate;   Good Recent;   Good Remote;   Fair  Judgement:  Fair  Insight:  Fair  Psychomotor Activity:  Normal  Concentration:  Concentration: Fair  Recall:  [employer] of Knowledge:  Fair  Language:  Fair  Akathisia:  No  Handed:  Right  AIMS (if indicated):     Assets:  Communication Skills Desire for Improvement Financial Resources/Insurance Housing Physical Health Resilience  ADL's:  Intact  Cognition:  WNL  Sleep:        Treatment Plan Summary: Plan 35 year old woman who appears to of been transiently agitated trying to cut herself and confused last night. At that time she had taken Xanax and Percocet but says that that was her normal medicine. Denies that she had taken any other drugs or medication. Today she has completely resolved her symptoms. Mood is calm. Totally denies suicidal ideation. Affect is appropriate. Thoughts appear to be clear. Although I don't have any proof that I would speculate that there is a chance that there may have been some other sleeping medicine involved. The most likely explanation for a oh should do for psychotic state like this  would be substance use. Patient was educated about the risks of combining benzodiazepines and narcotics. She also was educated about the importance of taking [place] with food. She will be taken off her involuntary commitment. Appears to be stable. Does not require hospital level treatment. Case reviewed with  emergency room doctor  Disposition: Patient does not meet criteria for psychiatric inpatient admission. Supportive therapy provided about ongoing stressors.  Alethia Berthold, MD 08/17/2015 1:31 PM

## 2015-08-17 NOTE — ED Notes (Signed)
Pt going home with husband she has a disability hearing on thurs and a psych appt on friday

## 2015-08-17 NOTE — Discharge Instructions (Signed)
You were evaluated by the emergency department psychiatrist, Dr. Weber Cooks, and are being discharged home to follow up with your psychiatrist. Next the next and return to the emergency department for any worsening condition including any possible remainder her to self or others or worsening depression.   Suicidal Feelings: How to Help Yourself Suicide is the taking of one's own life. If you feel as though life is getting too tough to handle and are thinking about suicide, get help right away. To get help:  Call your local emergency services (911 in the [place]  Call a suicide hotline to speak with a trained counselor who understands how you are feeling. The following is a list of suicide hotlines in the [place]. For a list of hotlines in [place], visit FindSkins.pl.  1-800-273-TALK (250)165-0175).  1-800-SUICIDE 430 756 9811).  484-265-1674. This is a hotline for Spanish speakers.  E7576207 (586)251-2903). This is a hotline for TTY users.  1-866-4-U-TREVOR 979-832-3880). This is a hotline for lesbian, gay, bisexual, transgender, or questioning youth.  Contact a crisis center or a local suicide prevention center. To find a crisis center or suicide prevention center:  Call your local hospital, clinic, community service organization, mental health center, social service provider, or health department. Ask for assistance in connecting to a crisis center.  Visit BankingRep.com.au for a list of crisis centers in the [place], or visit www.suicideprevention.ca/thinking-about-suicide/find-a-crisis-centre for a list of centers in [place].  Visit the following websites:  National Suicide Prevention Lifeline: www.suicidepreventionlifeline.org  Hopeline: www.hopeline.[place] for Suicide Prevention: PromotionalLoans.co.za  The [employer] (for lesbian, gay,  bisexual, transgender, or questioning youth): www.thetrevorproject.org HOW CAN I HELP MYSELF FEEL BETTER?  Promise yourself that you will not do anything drastic when you have suicidal feelings. Remember, there is hope. Many people have gotten through suicidal thoughts and feelings, and you will, too. You may have gotten through them before, and this proves that you can get through them again.  Let family, friends, teachers, or counselors know how you are feeling. Try not to isolate yourself from those who care about you. Remember, they will want to help you. Talk with someone every day, even if you do not feel sociable. Face-to-face conversation is best.  Call a mental health professional and see one regularly.  Visit your primary health care provider every year.  Eat a well-balanced diet, and space your meals so you eat regularly.  Get plenty of rest.  Avoid alcohol and drugs, and remove them from your home. They will only make you feel worse.  If you are thinking of taking a lot of medicine, give your medicine to someone who can give it to you one day at a time. If you are on antidepressants and are concerned you will overdose, let your health care provider know so he or she can give you safer medicines. Ask your mental health professional about the possible side effects of any medicines you are taking.  Remove weapons, poisons, knives, and anything else that could harm you from your home.  Try to stick to routines. Follow a schedule every day. Put self-care on your schedule.  Make a list of realistic goals, and cross them off when you achieve them. Accomplishments give a sense of worth.  Wait until you are feeling better before doing the things you find difficult or unpleasant.  Exercise if you are able. You will feel better if you exercise for even a half hour each day.  Go out in the sun or  into nature. This will help you recover from depression faster. If you have a favorite place  to walk, go there.  Do the things that have always given you pleasure. Play your favorite music, read a good book, paint a picture, play your favorite instrument, or do anything else that takes your mind off your depression if it is safe to do.  Keep your living space well lit.  When you are feeling well, write yourself a letter about tips and support that you can read when you are not feeling well.  Remember that life's difficulties can be sorted out with help. Conditions can be treated. You can work on thoughts and strategies that serve you well.   This information is not intended to replace advice given to you by your health care provider. Make sure you discuss any questions you have with your health care provider.   Document Released: 09/03/2002 Document Revised: 03/20/2014 Document Reviewed: 06/24/2013 Elsevier Interactive Patient Education [employer].

## 2015-08-17 NOTE — ED Notes (Signed)

## 2015-08-17 NOTE — ED Notes (Signed)
Pt. To BHU from ED ambulatory without difficulty, to room  BHU 4. Report from [employer]. Pt. Is alert and oriented, warm and dry in no distress. Pt. Denies SI, HI, and AVH. Patient states she is here because she had a manic episode. Pt. Calm and cooperative. Pt. Made aware of security cameras and Q15 minute rounds. Pt. Encouraged to let Nursing staff know of any concerns or needs.

## 2015-08-17 NOTE — ED Provider Notes (Signed)
I spoke with consulting psychiatrist, Dr. Weber Cooks who recommends discharge home with outpatient follow-up. He reversed her involuntary commitment paperwork. Next I did discuss with the patient and she feels calm and happy to go home. No suicidal ideation. [facility]    Lisa Roca, MD 08/17/15 1113
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
## 2016-05-18 ENCOUNTER — Telehealth: Payer: Self-pay | Admitting: Obstetrics and Gynecology
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
## 2016-05-31 NOTE — Telephone Encounter (Signed)
Pt is schedule 07/13/16 at 2:50

## 2016-06-05 DIAGNOSIS — H4710 Unspecified papilledema: Secondary | ICD-10-CM | POA: Diagnosis not present

## 2016-06-14 ENCOUNTER — Encounter: Payer: Self-pay | Admitting: *Deleted

## 2016-06-14 ENCOUNTER — Emergency Department
Admission: EM | Admit: 2016-06-14 | Discharge: 2016-06-14 | Discharge status: Against Medical Advice | Payer: Medicaid Other | Attending: Emergency Medicine | Admitting: Emergency Medicine

## 2016-06-14 DIAGNOSIS — F17200 Nicotine dependence, unspecified, uncomplicated: Secondary | ICD-10-CM | POA: Diagnosis not present

## 2016-06-14 DIAGNOSIS — Y9389 Activity, other specified: Secondary | ICD-10-CM | POA: Diagnosis not present

## 2016-06-14 DIAGNOSIS — M25551 Pain in right hip: Secondary | ICD-10-CM | POA: Insufficient documentation

## 2016-06-14 DIAGNOSIS — Y999 Unspecified external cause status: Secondary | ICD-10-CM | POA: Insufficient documentation

## 2016-06-14 DIAGNOSIS — R51 Headache: Secondary | ICD-10-CM | POA: Insufficient documentation

## 2016-06-14 DIAGNOSIS — S79911A Unspecified injury of right hip, initial encounter: Secondary | ICD-10-CM | POA: Diagnosis present

## 2016-06-14 DIAGNOSIS — Y92410 Unspecified street and highway as the place of occurrence of the external cause: Secondary | ICD-10-CM | POA: Insufficient documentation
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
## 2016-06-14 NOTE — ED Notes (Signed)
Patient seen walking out of treatment room independently.  Asked patient where she was going and patient states "you guys are taking too long, I just want to be home in my bed".  Encouraged patient to stay and be evaluated, patient declined.  Ambulating independently. Steady gait.  Posture upright and relaxed. NAD

## 2016-06-14 NOTE — ED Notes (Signed)
Patient on cell phone.  Asked patient to end conversation, but patient not willing to do so.  Will return to assess patient.
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
## 2016-07-13 ENCOUNTER — Encounter: Payer: Self-pay | Admitting: Obstetrics and Gynecology

## 2017-07-31 DIAGNOSIS — H5213 Myopia, bilateral: Secondary | ICD-10-CM | POA: Diagnosis not present

## 2017-09-17 DIAGNOSIS — M150 Primary generalized (osteo)arthritis: Secondary | ICD-10-CM | POA: Diagnosis not present

## 2017-09-17 DIAGNOSIS — G5602 Carpal tunnel syndrome, left upper limb: Secondary | ICD-10-CM | POA: Diagnosis not present

## 2017-09-17 DIAGNOSIS — G5601 Carpal tunnel syndrome, right upper limb: Secondary | ICD-10-CM | POA: Diagnosis not present

## 2017-10-15 DIAGNOSIS — M5432 Sciatica, left side: Secondary | ICD-10-CM | POA: Diagnosis not present

## 2017-10-15 DIAGNOSIS — M150 Primary generalized (osteo)arthritis: Secondary | ICD-10-CM | POA: Diagnosis not present

## 2017-10-15 DIAGNOSIS — M5441 Lumbago with sciatica, right side: Secondary | ICD-10-CM | POA: Diagnosis not present

## 2017-10-15 DIAGNOSIS — I10 Essential (primary) hypertension: Secondary | ICD-10-CM | POA: Diagnosis not present

## 2017-11-22 DIAGNOSIS — M150 Primary generalized (osteo)arthritis: Secondary | ICD-10-CM | POA: Diagnosis not present

## 2017-11-22 DIAGNOSIS — M545 Low back pain: Secondary | ICD-10-CM | POA: Diagnosis not present

## 2017-11-22 DIAGNOSIS — F3110 Bipolar disorder, current episode manic without psychotic features, unspecified: Secondary | ICD-10-CM | POA: Diagnosis not present

## 2017-11-23 DIAGNOSIS — H40033 Anatomical narrow angle, bilateral: Secondary | ICD-10-CM | POA: Diagnosis not present

## 2017-11-23 DIAGNOSIS — H16223 Keratoconjunctivitis sicca, not specified as Sjogren's, bilateral: Secondary | ICD-10-CM | POA: Diagnosis not present

## 2017-11-25 DIAGNOSIS — H5213 Myopia, bilateral: Secondary | ICD-10-CM | POA: Diagnosis not present

## 2017-12-05 DIAGNOSIS — H1013 Acute atopic conjunctivitis, bilateral: Secondary | ICD-10-CM | POA: Diagnosis not present

## 2017-12-05 DIAGNOSIS — H5213 Myopia, bilateral: Secondary | ICD-10-CM | POA: Diagnosis not present

## 2017-12-20 DIAGNOSIS — I10 Essential (primary) hypertension: Secondary | ICD-10-CM | POA: Diagnosis not present

## 2017-12-20 DIAGNOSIS — M150 Primary generalized (osteo)arthritis: Secondary | ICD-10-CM | POA: Diagnosis not present

## 2017-12-20 DIAGNOSIS — M5431 Sciatica, right side: Secondary | ICD-10-CM | POA: Diagnosis not present

## 2017-12-20 DIAGNOSIS — M5432 Sciatica, left side: Secondary | ICD-10-CM | POA: Diagnosis not present

## 2018-01-09 DIAGNOSIS — H1013 Acute atopic conjunctivitis, bilateral: Secondary | ICD-10-CM | POA: Diagnosis not present

## 2018-01-17 DIAGNOSIS — M150 Primary generalized (osteo)arthritis: Secondary | ICD-10-CM | POA: Diagnosis not present

## 2018-01-17 DIAGNOSIS — M5432 Sciatica, left side: Secondary | ICD-10-CM | POA: Diagnosis not present

## 2018-01-17 DIAGNOSIS — I10 Essential (primary) hypertension: Secondary | ICD-10-CM | POA: Diagnosis not present

## 2018-01-17 DIAGNOSIS — M5431 Sciatica, right side: Secondary | ICD-10-CM | POA: Diagnosis not present

## 2018-02-14 DIAGNOSIS — M5431 Sciatica, right side: Secondary | ICD-10-CM | POA: Diagnosis not present

## 2018-02-14 DIAGNOSIS — G5601 Carpal tunnel syndrome, right upper limb: Secondary | ICD-10-CM | POA: Diagnosis not present

## 2018-02-14 DIAGNOSIS — G5602 Carpal tunnel syndrome, left upper limb: Secondary | ICD-10-CM | POA: Diagnosis not present

## 2018-02-14 DIAGNOSIS — M150 Primary generalized (osteo)arthritis: Secondary | ICD-10-CM | POA: Diagnosis not present

## 2018-02-18 DIAGNOSIS — G8929 Other chronic pain: Secondary | ICD-10-CM | POA: Diagnosis not present

## 2018-03-18 DIAGNOSIS — J410 Simple chronic bronchitis: Secondary | ICD-10-CM | POA: Diagnosis not present

## 2018-03-18 DIAGNOSIS — F330 Major depressive disorder, recurrent, mild: Secondary | ICD-10-CM | POA: Diagnosis not present

## 2018-03-18 DIAGNOSIS — R05 Cough: Secondary | ICD-10-CM | POA: Diagnosis not present

## 2018-03-18 DIAGNOSIS — I10 Essential (primary) hypertension: Secondary | ICD-10-CM | POA: Diagnosis not present

## 2018-04-18 DIAGNOSIS — M150 Primary generalized (osteo)arthritis: Secondary | ICD-10-CM | POA: Diagnosis not present

## 2018-04-18 DIAGNOSIS — M5431 Sciatica, right side: Secondary | ICD-10-CM | POA: Diagnosis not present

## 2018-04-18 DIAGNOSIS — M5432 Sciatica, left side: Secondary | ICD-10-CM | POA: Diagnosis not present

## 2018-05-16 DIAGNOSIS — M150 Primary generalized (osteo)arthritis: Secondary | ICD-10-CM | POA: Diagnosis not present

## 2018-06-13 DIAGNOSIS — F418 Other specified anxiety disorders: Secondary | ICD-10-CM | POA: Diagnosis not present

## 2018-06-13 DIAGNOSIS — G8929 Other chronic pain: Secondary | ICD-10-CM | POA: Diagnosis not present

## 2018-07-12 DIAGNOSIS — M5431 Sciatica, right side: Secondary | ICD-10-CM | POA: Diagnosis not present

## 2018-07-12 DIAGNOSIS — M150 Primary generalized (osteo)arthritis: Secondary | ICD-10-CM | POA: Diagnosis not present

## 2018-07-12 DIAGNOSIS — M5432 Sciatica, left side: Secondary | ICD-10-CM | POA: Diagnosis not present

## 2018-07-12 DIAGNOSIS — G8929 Other chronic pain: Secondary | ICD-10-CM | POA: Diagnosis not present

## 2018-08-12 DIAGNOSIS — G8929 Other chronic pain: Secondary | ICD-10-CM | POA: Diagnosis not present

## 2018-08-12 DIAGNOSIS — F418 Other specified anxiety disorders: Secondary | ICD-10-CM | POA: Diagnosis not present

## 2018-08-22 ENCOUNTER — Emergency Department
Admission: EM | Admit: 2018-08-22 | Discharge: 2018-08-22 | Disposition: A | Discharge status: Home / Self Care | Payer: Medicaid Other | Attending: Emergency Medicine | Admitting: Emergency Medicine

## 2018-08-22 ENCOUNTER — Encounter: Payer: Self-pay | Admitting: Emergency Medicine

## 2018-08-22 ENCOUNTER — Other Ambulatory Visit: Payer: Self-pay

## 2018-08-22 ENCOUNTER — Emergency Department: Payer: Medicaid Other

## 2018-08-22 DIAGNOSIS — Z79899 Other long term (current) drug therapy: Secondary | ICD-10-CM | POA: Diagnosis not present

## 2018-08-22 DIAGNOSIS — R0602 Shortness of breath: Secondary | ICD-10-CM | POA: Diagnosis not present

## 2018-08-22 DIAGNOSIS — J069 Acute upper respiratory infection, unspecified: Secondary | ICD-10-CM

## 2018-08-22 DIAGNOSIS — F17210 Nicotine dependence, cigarettes, uncomplicated: Secondary | ICD-10-CM | POA: Insufficient documentation

## 2018-08-22 DIAGNOSIS — B8789 Myiasis of other sites: Secondary | ICD-10-CM | POA: Insufficient documentation

## 2018-08-22 DIAGNOSIS — R51 Headache: Secondary | ICD-10-CM | POA: Diagnosis not present

## 2018-08-22 DIAGNOSIS — Z20828 Contact with and (suspected) exposure to other viral communicable diseases: Secondary | ICD-10-CM | POA: Insufficient documentation

## 2018-08-22 DIAGNOSIS — B9789 Other viral agents as the cause of diseases classified elsewhere: Secondary | ICD-10-CM | POA: Diagnosis not present

## 2018-08-22 DIAGNOSIS — R05 Cough: Secondary | ICD-10-CM | POA: Diagnosis not present

## 2018-08-22 LAB — CBC
HCT: 40 % (ref 36.0–46.0)
Hemoglobin: 13.2 g/dL (ref 12.0–15.0)
MCH: 28.4 pg (ref 26.0–34.0)
MCHC: 33 g/dL (ref 30.0–36.0)
MCV: 86 fL (ref 80.0–100.0)
Platelets: 256 10*3/uL (ref 150–400)
RBC: 4.65 MIL/uL (ref 3.87–5.11)
RDW: 13.2 % (ref 11.5–15.5)
WBC: 6.7 10*3/uL (ref 4.0–10.5)
nRBC: 0 % (ref 0.0–0.2)

## 2018-08-22 LAB — COMPREHENSIVE METABOLIC PANEL
ALT: 17 U/L (ref 0–44)
AST: 16 U/L (ref 15–41)
Albumin: 3.9 g/dL (ref 3.5–5.0)
Alkaline Phosphatase: 51 U/L (ref 38–126)
Anion gap: 5 (ref 5–15)
BUN: 7 mg/dL (ref 6–20)
CO2: 27 mmol/L (ref 22–32)
Calcium: 8.8 mg/dL — ABNORMAL LOW (ref 8.9–10.3)
Chloride: 105 mmol/L (ref 98–111)
Creatinine, Ser: 0.61 mg/dL (ref 0.44–1.00)
GFR calc Af Amer: >60 mL/min (ref >60)
GFR calc non Af Amer: >60 mL/min (ref >60)
Glucose, Bld: 90 mg/dL (ref 70–99)
Potassium: 3.9 mmol/L (ref 3.5–5.1)
Sodium: 137 mmol/L (ref 135–145)
Total Bilirubin: 0.3 mg/dL (ref 0.3–1.2)
Total Protein: 6.5 g/dL (ref 6.5–8.1)

## 2018-08-22 LAB — SARS CORONAVIRUS 2 BY RT PCR (HOSPITAL ORDER, PERFORMED IN ~~LOC~~ HOSPITAL LAB): SARS Coronavirus 2: NEGATIVE

## 2018-08-22 LAB — GROUP A STREP BY PCR: Group A Strep by PCR: NOT DETECTED

## 2018-08-22 MED ORDER — GUAIFENESIN-DM 100-10 MG/5ML PO SYRP: 5.0000 mL | ORAL_SOLUTION | ORAL | 0 refills | Status: DC | PRN

## 2018-08-22 MED ORDER — ACETAMINOPHEN-CODEINE 120-12 MG/5ML PO SOLN
5.0000 mL | Freq: Once | ORAL | Status: AC
  Administered 2018-08-22: 14:00:00 5 mL via ORAL
  Filled 2018-08-22 (×4): qty 5

## 2018-08-22 MED ORDER — BENZONATATE 100 MG PO CAPS: 100.0000 mg | ORAL_CAPSULE | Freq: Three times a day (TID) | ORAL | 0 refills | Status: DC | PRN

## 2018-08-22 MED ORDER — LIDOCAINE VISCOUS HCL 2 % MT SOLN: 10.0000 mL | OROMUCOSAL | 0 refills | Status: DC | PRN

## 2018-08-22 MED ORDER — KETOROLAC TROMETHAMINE 30 MG/ML IJ SOLN
30.0000 mg | Freq: Once | INTRAMUSCULAR | Status: AC
  Administered 2018-08-22: 15:00:00 30 mg via INTRAVENOUS
  Filled 2018-08-22: qty 1

## 2018-08-22 MED ORDER — LIDOCAINE VISCOUS HCL 2 % MT SOLN
15.0000 mL | Freq: Once | OROMUCOSAL | Status: AC
  Administered 2018-08-22: 13:00:00 15 mL via OROMUCOSAL
  Filled 2018-08-22: qty 15

## 2018-08-22 NOTE — ED Provider Notes (Signed)
[facility] Emergency Department Provider Note  ____________________________________________  Time seen: Approximately 12:26 PM  I have reviewed the triage vital signs and the nursing notes.   HISTORY  Chief Complaint Sore Throat, Fever, Headache, and Generalized Body Aches    HPI Paige Carr is a 38 y.o. female that presents to the emergency department for evaluation of headache, bilateral eye pain, sore throat, nasal congestion, productive cough with green sputum, body aches for 3 days.  Patient states that she has felt tired for the last 3 days.  She had a couple of episodes of vomiting yesterday, none today.  No sick contacts.  Patient smokes a pack of cigarettes every 5 days.  No diarrhea.   Past Medical History:  Diagnosis Date  . Bipolar 1 disorder with moderate mania ([place])     Patient Active Problem List   Diagnosis Date Noted  . 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 Bipolar disorder ([place]) 08/17/2015  . Suicidal ideation 08/17/2015  . Involuntary commitment 08/17/2015    Past Surgical History:  Procedure Laterality Date  . CHOLECYSTECTOMY    . HERNIA REPAIR      Prior to Admission medications   Medication Sig Start Date End Date Taking? Authorizing Provider  benzonatate (TESSALON PERLES) 100 MG capsule Take 1 capsule (100 mg total) by mouth 3 (three) times daily as needed for cough. 08/22/18 08/22/19  Laban Emperor, PA-C  cyclobenzaprine (FLEXERIL) 10 MG tablet Take 1 tablet (10 mg total) by mouth every 8 (eight) hours as needed for muscle spasms. 08/02/15   Beers, Pierce Crane, PA-C  guaiFENesin-dextromethorphan (ROBITUSSIN DM) 100-10 MG/5ML syrup Take 5 mLs by mouth every 4 (four) hours as needed for cough. 08/22/18   Laban Emperor, PA-C  ibuprofen (ADVIL,MOTRIN) 800 MG tablet Take 1 tablet (800 mg total) by mouth every 8 (eight) hours as needed. 08/02/15   Beers, Pierce Crane, PA-C  lidocaine  (XYLOCAINE) 2 % solution Use as directed 10 mLs in the mouth or throat as needed. 08/22/18   Laban Emperor, PA-C    Allergies Latex  No family history on file.  Social History Social History   Tobacco Use  . Smoking status: Current Every Day Smoker  Substance Use Topics  . Alcohol use: No  . Drug use: No     Review of Systems  Constitutional: Positive for fever. Eyes: No visual changes. No discharge. ENT: Positive for congestion and rhinorrhea. Cardiovascular: No chest pain. Respiratory: Positive for cough. No SOB. Gastrointestinal: No abdominal pain.  No nausea.  Positive for vomiting yesterday.  No diarrhea.  No constipation. Musculoskeletal: Positive for body aches. Skin: Negative for rash, abrasions, lacerations, ecchymosis. Neurological: Positive for headache.   ____________________________________________   PHYSICAL EXAM:  VITAL SIGNS: ED Triage Vitals  Enc Vitals Group     BP 08/22/18 1106 135/79     Pulse Rate 08/22/18 1106 90     Resp --      Temp 08/22/18 1106 99.2 F (37.3 C)     Temp Source 08/22/18 1106 Oral     SpO2 08/22/18 1106 98 %     Weight 08/22/18 1045 160 lb (72.6 kg)     Height 08/22/18 1045 5\' 3"  (1.6 m)     Head Circumference --      Peak Flow --      Pain Score 08/22/18 1045 10     Pain Loc --      Pain Edu? --  Excl. in [place]? --      Constitutional: Alert and oriented. Well appearing and in no acute distress. Eyes: Conjunctivae are normal. PERRL. EOMI. No discharge. Head: Atraumatic. ENT: No frontal and maxillary sinus tenderness.      Ears: Tympanic membranes pearly gray with good landmarks. No discharge.      Nose: Mild congestion/rhinnorhea.      Mouth/Throat: Mucous membranes are moist. Oropharynx non-erythematous. Tonsils not enlarged. No exudates. Uvula midline. Neck: No stridor.   Hematological/Lymphatic/Immunilogical: No cervical lymphadenopathy. Cardiovascular: Normal rate, regular rhythm.  Good peripheral  circulation. Respiratory: Normal respiratory effort without tachypnea or retractions. Lungs CTAB. Good air entry to the bases with no decreased or absent breath sounds. Gastrointestinal: Bowel sounds 4 quadrants. Soft and nontender to palpation. No guarding or rigidity. No palpable masses. No distention. Musculoskeletal: Full range of motion to all extremities. No gross deformities appreciated. Neurologic:  Normal speech and language. No gross focal neurologic deficits are appreciated.  Skin:  Skin is warm, dry and intact. No rash noted. Psychiatric: Mood and affect are normal. Speech and behavior are normal. Patient exhibits appropriate insight and judgement.   ____________________________________________   LABS (all labs ordered are listed, but only abnormal results are displayed)  Labs Reviewed  COMPREHENSIVE METABOLIC PANEL - Abnormal; Notable for the following components:      Result Value   Calcium 8.8 (*)    All other components within normal limits  GROUP A STREP BY PCR  SARS CORONAVIRUS 2 (HOSPITAL ORDER, [place] [facility])  CBC   ____________________________________________  EKG   ____________________________________________  RADIOLOGY Robinette Haines, personally viewed and evaluated these images (plain radiographs) as part of my medical decision making, as well as reviewing the written report by the radiologist.  Dg Chest Portable 1 View  Result Date: 08/22/2018 CLINICAL DATA:  Cough and fever.  Shortness of breath EXAM: PORTABLE CHEST 1 VIEW COMPARISON:  None. FINDINGS: Lungs are clear. Heart size and pulmonary vascularity are normal. No adenopathy. No bone lesions. IMPRESSION: No edema or consolidation. Electronically Signed   By: Lowella Grip III M.D.   On: 08/22/2018 12:47    ____________________________________________    PROCEDURES  Procedure(s) performed:    Procedures    Medications  acetaminophen-codeine 120-12 MG/5ML  solution 5 mL (5 mLs Oral Given 08/22/18 1335)  lidocaine (XYLOCAINE) 2 % viscous mouth solution 15 mL (15 mLs Mouth/Throat Given 08/22/18 1245)  ketorolac (TORADOL) 30 MG/ML injection 30 mg (30 mg Intravenous Given 08/22/18 1513)     ____________________________________________   INITIAL IMPRESSION / ASSESSMENT AND PLAN / ED COURSE  Pertinent labs & imaging results that were available during my care of the patient were reviewed by me and considered in my medical decision making (see chart for details).  Review of the [place] CSRS was performed in accordance of the [place] prior to dispensing any controlled drugs.     Patient's diagnosis is consistent with viral URI. Vital signs and exam are reassuring chest x-ray negative for acute cardiopulmonary processes.  COVID test is negative.. Patient appears well and is staying well hydrated. Patient feels comfortable going home. Patient will be discharged home with prescriptions for Tylenol, robitussin, and Tessalon Perles. Patient is to follow up with primary care as needed or otherwise directed. Patient is given ED precautions to return to the ED for any worsening or new symptoms.     ____________________________________________  FINAL CLINICAL IMPRESSION(S) / ED DIAGNOSES  Final diagnoses:  Viral URI with cough  NEW MEDICATIONS STARTED DURING THIS VISIT:  ED Discharge Orders         Ordered    guaiFENesin-dextromethorphan (ROBITUSSIN DM) 100-10 MG/5ML syrup  Every 4 hours PRN,   Status:  Discontinued     08/22/18 1441    lidocaine (XYLOCAINE) 2 % solution  As needed,   Status:  Discontinued     08/22/18 1441    benzonatate (TESSALON PERLES) 100 MG capsule  3 times daily PRN,   Status:  Discontinued     08/22/18 1441    benzonatate (TESSALON PERLES) 100 MG capsule  3 times daily PRN     08/22/18 1442    guaiFENesin-dextromethorphan (ROBITUSSIN DM) 100-10 MG/5ML syrup  Every 4 hours PRN     08/22/18 1442    lidocaine (XYLOCAINE) 2 %  solution  As needed     08/22/18 1442              This chart was dictated using voice recognition software/Dragon. Despite best efforts to proofread, errors can occur which can change the meaning. Any change was purely unintentional.    Laban Emperor, PA-C 08/22/18 1949    Carrie Mew, MD 08/23/18 [phone]

## 2018-08-22 NOTE — Discharge Instructions (Signed)
Your blood work and chest x-ray are reassuring.  Your strep and COVID tests were negative.  You have a viral upper respiratory infection.  Please stay home and drink plenty of fluids.  I have given you some medications for your symptoms.

## 2018-08-22 NOTE — ED Notes (Signed)
Pt c/o blurry vision, sore throat, nasal drainage, green productive cough, body aches, fevers, and trouble breathing for 4 days.

## 2018-08-22 NOTE — ED Notes (Signed)
Pt given sandwich while waiting on labs.

## 2018-08-22 NOTE — ED Triage Notes (Signed)
Pt reports sore throat, fever, bodyaches and cough for the past few days. Pt denies exposures but states that she is around her kids and husband.

## 2018-08-22 NOTE — ED Notes (Signed)
IV removed.

## 2018-09-03 DIAGNOSIS — J410 Simple chronic bronchitis: Secondary | ICD-10-CM | POA: Diagnosis not present

## 2018-09-03 DIAGNOSIS — J441 Chronic obstructive pulmonary disease with (acute) exacerbation: Secondary | ICD-10-CM | POA: Diagnosis not present

## 2018-09-10 DIAGNOSIS — G5601 Carpal tunnel syndrome, right upper limb: Secondary | ICD-10-CM | POA: Diagnosis not present

## 2018-09-10 DIAGNOSIS — M5431 Sciatica, right side: Secondary | ICD-10-CM | POA: Diagnosis not present

## 2018-09-10 DIAGNOSIS — G5602 Carpal tunnel syndrome, left upper limb: Secondary | ICD-10-CM | POA: Diagnosis not present

## 2018-09-10 DIAGNOSIS — M5432 Sciatica, left side: Secondary | ICD-10-CM | POA: Diagnosis not present

## 2018-09-16 ENCOUNTER — Other Ambulatory Visit: Payer: Self-pay

## 2018-09-16 ENCOUNTER — Encounter: Payer: Self-pay | Admitting: Emergency Medicine

## 2018-09-16 ENCOUNTER — Emergency Department: Payer: Medicaid Other

## 2018-09-16 ENCOUNTER — Emergency Department
Admission: EM | Admit: 2018-09-16 | Discharge: 2018-09-17 | Disposition: A | Discharge status: Home / Self Care | Payer: Medicaid Other | Attending: Emergency Medicine | Admitting: Emergency Medicine

## 2018-09-16 DIAGNOSIS — R51 Headache: Secondary | ICD-10-CM | POA: Diagnosis not present

## 2018-09-16 DIAGNOSIS — H02846 Edema of left eye, unspecified eyelid: Secondary | ICD-10-CM | POA: Diagnosis not present

## 2018-09-16 DIAGNOSIS — H11422 Conjunctival edema, left eye: Secondary | ICD-10-CM | POA: Diagnosis not present

## 2018-09-16 DIAGNOSIS — H5712 Ocular pain, left eye: Secondary | ICD-10-CM | POA: Insufficient documentation

## 2018-09-16 DIAGNOSIS — Z5321 Procedure and treatment not carried out due to patient leaving prior to being seen by health care provider: Secondary | ICD-10-CM | POA: Insufficient documentation

## 2018-09-16 DIAGNOSIS — H538 Other visual disturbances: Secondary | ICD-10-CM | POA: Diagnosis not present

## 2018-09-16 HISTORY — DX: Epilepsy, unspecified, not intractable, without status epilepticus: G40.909

## 2018-09-16 HISTORY — DX: Anxiety disorder, unspecified: F41.9

## 2018-09-16 HISTORY — DX: Carpal tunnel syndrome, unspecified upper limb: G56.00

## 2018-09-16 HISTORY — DX: Unspecified papilledema: H47.10

## 2018-09-16 HISTORY — DX: Malignant neoplasm of uterus, part unspecified: C55

## 2018-09-16 LAB — COMPREHENSIVE METABOLIC PANEL
ALT: 16 U/L (ref 0–44)
AST: 18 U/L (ref 15–41)
Albumin: 4.2 g/dL (ref 3.5–5.0)
Alkaline Phosphatase: 52 U/L (ref 38–126)
Anion gap: 8 (ref 5–15)
BUN: 12 mg/dL (ref 6–20)
CO2: 26 mmol/L (ref 22–32)
Calcium: 8.9 mg/dL (ref 8.9–10.3)
Chloride: 102 mmol/L (ref 98–111)
Creatinine, Ser: 0.7 mg/dL (ref 0.44–1.00)
GFR calc Af Amer: >60 mL/min (ref >60)
GFR calc non Af Amer: >60 mL/min (ref >60)
Glucose, Bld: 114 mg/dL — ABNORMAL HIGH (ref 70–99)
Potassium: 3.9 mmol/L (ref 3.5–5.1)
Sodium: 136 mmol/L (ref 135–145)
Total Bilirubin: 0.2 mg/dL — ABNORMAL LOW (ref 0.3–1.2)
Total Protein: 6.7 g/dL (ref 6.5–8.1)

## 2018-09-16 LAB — CBC WITH DIFFERENTIAL/PLATELET
Abs Immature Granulocytes: 0.02 10*3/uL (ref 0.00–0.07)
Basophils Absolute: 0 10*3/uL (ref 0.0–0.1)
Basophils Relative: 1 %
Eosinophils Absolute: 0 10*3/uL (ref 0.0–0.5)
Eosinophils Relative: 0 %
HCT: 39.5 % (ref 36.0–46.0)
Hemoglobin: 13.2 g/dL (ref 12.0–15.0)
Immature Granulocytes: 0 %
Lymphocytes Relative: 36 %
Lymphs Abs: 2.8 10*3/uL (ref 0.7–4.0)
MCH: 28.6 pg (ref 26.0–34.0)
MCHC: 33.4 g/dL (ref 30.0–36.0)
MCV: 85.5 fL (ref 80.0–100.0)
Monocytes Absolute: 0.5 10*3/uL (ref 0.1–1.0)
Monocytes Relative: 7 %
Neutro Abs: 4.4 10*3/uL (ref 1.7–7.7)
Neutrophils Relative %: 56 %
Platelets: 290 10*3/uL (ref 150–400)
RBC: 4.62 MIL/uL (ref 3.87–5.11)
RDW: 13.6 % (ref 11.5–15.5)
WBC: 7.9 10*3/uL (ref 4.0–10.5)
nRBC: 0 % (ref 0.0–0.2)

## 2018-09-16 MED ORDER — GADOBUTROL 1 MMOL/ML IV SOLN
7.0000 mL | Freq: Once | INTRAVENOUS | Status: AC | PRN
  Administered 2018-09-16: 22:00:00 7 mL via INTRAVENOUS

## 2018-09-16 NOTE — ED Notes (Signed)
Pt to CT

## 2018-09-16 NOTE — ED Triage Notes (Signed)
Pt present to ED with sudden onset of left eye pain and blurred vision. Pt states she was watching tv at onset. Pt noticed a raised area across the center of her eye and swelling to eye lid as well. Pt was seen by her optometrist and told to come immediately to ED for CT and MRI. Pt states she can only see colors currently but no shapes or letters. Swelling improved since receiving eyedrops from MD.

## 2018-09-17 DIAGNOSIS — H11422 Conjunctival edema, left eye: Secondary | ICD-10-CM | POA: Diagnosis not present

## 2018-10-01 DIAGNOSIS — M545 Low back pain: Secondary | ICD-10-CM | POA: Diagnosis not present

## 2018-10-01 DIAGNOSIS — M542 Cervicalgia: Secondary | ICD-10-CM | POA: Diagnosis not present

## 2018-11-04 DIAGNOSIS — M545 Low back pain: Secondary | ICD-10-CM | POA: Diagnosis not present

## 2018-11-04 DIAGNOSIS — M542 Cervicalgia: Secondary | ICD-10-CM | POA: Diagnosis not present

## 2018-12-02 DIAGNOSIS — M5431 Sciatica, right side: Secondary | ICD-10-CM | POA: Diagnosis not present

## 2018-12-02 DIAGNOSIS — M5432 Sciatica, left side: Secondary | ICD-10-CM | POA: Diagnosis not present

## 2018-12-02 DIAGNOSIS — I10 Essential (primary) hypertension: Secondary | ICD-10-CM | POA: Diagnosis not present

## 2018-12-02 DIAGNOSIS — M545 Low back pain: Secondary | ICD-10-CM | POA: Diagnosis not present

## 2019-01-27 DIAGNOSIS — I10 Essential (primary) hypertension: Secondary | ICD-10-CM | POA: Diagnosis not present

## 2019-01-27 DIAGNOSIS — G8929 Other chronic pain: Secondary | ICD-10-CM | POA: Diagnosis not present

## 2019-01-27 DIAGNOSIS — F330 Major depressive disorder, recurrent, mild: Secondary | ICD-10-CM | POA: Diagnosis not present

## 2019-02-19 DIAGNOSIS — M150 Primary generalized (osteo)arthritis: Secondary | ICD-10-CM | POA: Diagnosis not present

## 2019-02-19 DIAGNOSIS — F330 Major depressive disorder, recurrent, mild: Secondary | ICD-10-CM | POA: Diagnosis not present

## 2019-02-19 DIAGNOSIS — G8929 Other chronic pain: Secondary | ICD-10-CM | POA: Diagnosis not present

## 2019-02-24 DIAGNOSIS — M150 Primary generalized (osteo)arthritis: Secondary | ICD-10-CM | POA: Diagnosis not present

## 2019-02-24 DIAGNOSIS — I10 Essential (primary) hypertension: Secondary | ICD-10-CM | POA: Diagnosis not present

## 2019-02-24 DIAGNOSIS — G5601 Carpal tunnel syndrome, right upper limb: Secondary | ICD-10-CM | POA: Diagnosis not present

## 2019-02-24 DIAGNOSIS — M5431 Sciatica, right side: Secondary | ICD-10-CM | POA: Diagnosis not present

## 2019-04-02 DIAGNOSIS — M5432 Sciatica, left side: Secondary | ICD-10-CM | POA: Diagnosis not present

## 2019-04-02 DIAGNOSIS — I10 Essential (primary) hypertension: Secondary | ICD-10-CM | POA: Diagnosis not present

## 2019-04-02 DIAGNOSIS — M150 Primary generalized (osteo)arthritis: Secondary | ICD-10-CM | POA: Diagnosis not present

## 2019-04-02 DIAGNOSIS — M5431 Sciatica, right side: Secondary | ICD-10-CM | POA: Diagnosis not present

## 2019-05-26 DIAGNOSIS — M5431 Sciatica, right side: Secondary | ICD-10-CM | POA: Diagnosis not present

## 2019-05-26 DIAGNOSIS — M150 Primary generalized (osteo)arthritis: Secondary | ICD-10-CM | POA: Diagnosis not present

## 2019-05-26 DIAGNOSIS — M545 Low back pain: Secondary | ICD-10-CM | POA: Diagnosis not present

## 2019-05-26 DIAGNOSIS — M5432 Sciatica, left side: Secondary | ICD-10-CM | POA: Diagnosis not present

## 2019-07-14 ENCOUNTER — Other Ambulatory Visit: Payer: Self-pay

## 2019-07-14 ENCOUNTER — Emergency Department
Admission: EM | Admit: 2019-07-14 | Discharge: 2019-07-14 | Disposition: A | Discharge status: Home / Self Care | Payer: Medicaid Other | Attending: Emergency Medicine | Admitting: Emergency Medicine

## 2019-07-14 ENCOUNTER — Emergency Department: Payer: Medicaid Other

## 2019-07-14 ENCOUNTER — Encounter: Payer: Self-pay | Admitting: Emergency Medicine

## 2019-07-14 DIAGNOSIS — F17210 Nicotine dependence, cigarettes, uncomplicated: Secondary | ICD-10-CM | POA: Insufficient documentation

## 2019-07-14 DIAGNOSIS — J039 Acute tonsillitis, unspecified: Secondary | ICD-10-CM | POA: Diagnosis not present

## 2019-07-14 DIAGNOSIS — J209 Acute bronchitis, unspecified: Secondary | ICD-10-CM | POA: Diagnosis not present

## 2019-07-14 DIAGNOSIS — R05 Cough: Secondary | ICD-10-CM | POA: Diagnosis not present

## 2019-07-14 DIAGNOSIS — Z79899 Other long term (current) drug therapy: Secondary | ICD-10-CM | POA: Insufficient documentation

## 2019-07-14 LAB — GROUP A STREP BY PCR: Group A Strep by PCR: NOT DETECTED

## 2019-07-14 MED ORDER — OXYCODONE-ACETAMINOPHEN 5-325 MG PO TABS
1.0000 | ORAL_TABLET | Freq: Once | ORAL | Status: AC
  Administered 2019-07-14: 1 via ORAL
  Filled 2019-07-14: qty 1

## 2019-07-14 MED ORDER — IBUPROFEN 800 MG PO TABS: 800.0000 mg | ORAL_TABLET | Freq: Three times a day (TID) | ORAL | 0 refills | Status: AC | PRN

## 2019-07-14 MED ORDER — AMOXICILLIN-POT CLAVULANATE 875-125 MG PO TABS: 1.0000 | ORAL_TABLET | Freq: Two times a day (BID) | ORAL | 0 refills | Status: AC

## 2019-07-14 MED ORDER — PREDNISONE 10 MG (21) PO TBPK: ORAL_TABLET | ORAL | 0 refills | Status: DC

## 2019-07-14 MED ORDER — IPRATROPIUM-ALBUTEROL 0.5-2.5 (3) MG/3ML IN SOLN
3.0000 mL | Freq: Once | RESPIRATORY_TRACT | Status: AC
  Administered 2019-07-14: 18:00:00 3 mL via RESPIRATORY_TRACT
  Filled 2019-07-14: qty 3

## 2019-07-14 NOTE — Discharge Instructions (Addendum)
Follow-up with your regular doctor as needed. Use your regular pain medications as needed.   Use ibuprofen Use the antibiotic and steroids as prescribed

## 2019-07-14 NOTE — ED Provider Notes (Signed)
[facility] Emergency Department Provider Note  ____________________________________________   First MD Initiated Contact with Patient 07/14/19 1727     (approximate)  I have reviewed the triage vital signs and the nursing notes.   HISTORY  Chief Complaint Sore Throat    HPI Paige Carr is a 39 y.o. female presents emergency department complaining of sore throat, left ear pain, headache, cough/bronchitis, and wheezing.  Unsure of fever.  States she thinks she had one earlier in the week.  No chest pain/shortness of breath.  No known exposure to Covid    Past Medical History:  Diagnosis Date  . Anxiety   . Bipolar 1 disorder with moderate mania ([place])   . Carpal tunnel syndrome   . Epilepsy ([place])   . Papilledema   . Uterine cancer [facility])     Patient Active Problem List   Diagnosis Date Noted  . 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 Bipolar disorder ([place]) 08/17/2015  . Suicidal ideation 08/17/2015  . Involuntary commitment 08/17/2015    Past Surgical History:  Procedure Laterality Date  . ABDOMINAL HYSTERECTOMY    . CHOLECYSTECTOMY    . HAND SURGERY    . HERNIA REPAIR      Prior to Admission medications   Medication Sig Start Date End Date Taking? Authorizing Provider  ALPRAZolam Duanne Moron) 1 MG tablet Take 1 mg by mouth at bedtime as needed for anxiety.   Yes [provider]  amoxicillin-clavulanate (AUGMENTIN) 875-125 MG tablet Take 1 tablet by mouth 2 (two) times daily for 7 days. 07/14/19 07/21/19  Abdifatah Colquhoun, Linden Dolin, PA-C  ibuprofen (ADVIL) 800 MG tablet Take 1 tablet (800 mg total) by mouth every 8 (eight) hours as needed. 07/14/19   Sarvesh Meddaugh, Linden Dolin, PA-C  predniSONE (STERAPRED UNI-PAK 21 TAB) 10 MG (21) TBPK tablet Take 6 pills on day one then decrease by 1 pill each day 07/14/19   Versie Starks, PA-C    Allergies Latex  History reviewed. No pertinent family history.  Social History  Social History   Tobacco Use  . Smoking status: Current Every Day Smoker    Packs/day: 0.50    Types: Cigarettes  . Smokeless tobacco: Never Used  Substance Use Topics  . Alcohol use: No  . Drug use: No    Review of Systems  Constitutional: No fever/chills Eyes: No visual changes. ENT: Positive sore throat. Respiratory: Positive cough Cardiovascular: Denies chest pain Gastrointestinal: Denies abdominal pain Genitourinary: Negative for dysuria. Musculoskeletal: Negative for back pain. Skin: Negative for rash. Psychiatric: no mood changes,     ____________________________________________   PHYSICAL EXAM:  VITAL SIGNS: ED Triage Vitals  Enc Vitals Group     BP 07/14/19 1727 130/88     Pulse Rate 07/14/19 1726 80     Resp 07/14/19 1726 18     Temp 07/14/19 1726 98.9 F (37.2 C)     Temp Source 07/14/19 1726 Oral     SpO2 07/14/19 1726 99 %     Weight 07/14/19 1718 160 lb 0.9 oz (72.6 kg)     Height 07/14/19 1718 5\' 3"  (1.6 m)     Head Circumference --      Peak Flow --      Pain Score 07/14/19 1718 8     Pain Loc --      Pain Edu? --      Excl. in [place]? --     Constitutional: Alert and oriented. Well  appearing and in no acute distress. Eyes: Conjunctivae are normal.  Head: Atraumatic. Nose: No congestion/rhinnorhea. Mouth/Throat: Mucous membranes are moist.  Throat with a large amount of exudate noted on the left tonsil Neck:  supple no lymphadenopathy noted Cardiovascular: Normal rate, regular rhythm. Heart sounds are normal Respiratory: Normal respiratory effort.  No retractions, lungs wheezing bilaterally GU: deferred Musculoskeletal: FROM all extremities, warm and well perfused Neurologic:  Normal speech and language.  Skin:  Skin is warm, dry and intact. No rash noted. Psychiatric: Mood and affect are normal. Speech and behavior are normal.  ____________________________________________   LABS (all labs ordered are listed, but only abnormal results  are displayed)  Labs Reviewed  GROUP A STREP BY PCR   ____________________________________________   ____________________________________________  RADIOLOGY  Chest x-ray is negative  ____________________________________________   PROCEDURES  Procedure(s) performed: duoneb  Procedures    ____________________________________________   INITIAL IMPRESSION / ASSESSMENT AND PLAN / ED COURSE  Pertinent labs & imaging results that were available during my care of the patient were reviewed by me and considered in my medical decision making (see chart for details).   Patient is 39 year old female presents emergency department with sore throat and URI symptoms.  See HPI  Physical exam shows patient to have a large amount of exudate on left tonsil.  Lungs with wheezing bilaterally.  Remainder the exam is unremarkable  Chest x-ray, strep swab, DuoNeb,   ----------------------------------------- 6:29 PM on 07/14/2019 -----------------------------------------  Chest x-ray is normal, strep swab negative, DuoNeb increased her air movement.   Pt's pdmp score is 480, received large amount of percocet and tramadol on 06-23-2019  Pt was given rx for prednisone, ibuprofen, augmentin She is to follow-up with ENT if not improving in 2 to 3 days.  Return emergency department worsening.  States she understands will comply.  Is discharged stable condition.   Paige Carr was evaluated in Emergency Department on 07/14/2019 for the symptoms described in the history of present illness. She was evaluated in the context of the global COVID-19 pandemic, which necessitated consideration that the patient might be at risk for infection with the SARS-CoV-2 virus that causes COVID-19. Institutional protocols and algorithms that pertain to the evaluation of patients at risk for COVID-19 are in a state of rapid change based on information released by regulatory bodies including the CDC and federal and state  organizations. These policies and algorithms were followed during the patient's care in the ED.   As part of my medical decision making, I reviewed the following data within the [facility] notes reviewed and incorporated, Labs reviewed , Old chart reviewed, Radiograph reviewed , Notes from prior ED visits and [place] Controlled Substance Database  ____________________________________________   FINAL CLINICAL IMPRESSION(S) / ED DIAGNOSES  Final diagnoses:  Acute tonsillitis, unspecified etiology  Acute bronchitis, unspecified organism      NEW MEDICATIONS STARTED DURING THIS VISIT:  New Prescriptions   AMOXICILLIN-CLAVULANATE (AUGMENTIN) 875-125 MG TABLET    Take 1 tablet by mouth 2 (two) times daily for 7 days.   IBUPROFEN (ADVIL) 800 MG TABLET    Take 1 tablet (800 mg total) by mouth every 8 (eight) hours as needed.   PREDNISONE (STERAPRED UNI-PAK 21 TAB) 10 MG (21) TBPK TABLET    Take 6 pills on day one then decrease by 1 pill each day     Note:  This document was prepared using Dragon voice recognition software and may include unintentional dictation errors.  Versie Starks, PA-C 07/14/19 Alen Bleacher, MD 07/15/19 Laureen Abrahams

## 2019-07-14 NOTE — ED Triage Notes (Signed)
Sore throat, left ear, left eye, left side of head pain x1 week.

## 2019-07-28 DIAGNOSIS — G8929 Other chronic pain: Secondary | ICD-10-CM | POA: Diagnosis not present

## 2019-07-28 DIAGNOSIS — I10 Essential (primary) hypertension: Secondary | ICD-10-CM | POA: Diagnosis not present

## 2019-08-06 DIAGNOSIS — S299XXA Unspecified injury of thorax, initial encounter: Secondary | ICD-10-CM | POA: Diagnosis not present

## 2019-08-21 DIAGNOSIS — M5431 Sciatica, right side: Secondary | ICD-10-CM | POA: Diagnosis not present

## 2019-08-21 DIAGNOSIS — M5432 Sciatica, left side: Secondary | ICD-10-CM | POA: Diagnosis not present

## 2019-08-21 DIAGNOSIS — M150 Primary generalized (osteo)arthritis: Secondary | ICD-10-CM | POA: Diagnosis not present

## 2019-08-21 DIAGNOSIS — I10 Essential (primary) hypertension: Secondary | ICD-10-CM | POA: Diagnosis not present

## 2019-09-17 DIAGNOSIS — M5432 Sciatica, left side: Secondary | ICD-10-CM | POA: Diagnosis not present

## 2019-09-17 DIAGNOSIS — M5431 Sciatica, right side: Secondary | ICD-10-CM | POA: Diagnosis not present

## 2019-09-17 DIAGNOSIS — M150 Primary generalized (osteo)arthritis: Secondary | ICD-10-CM | POA: Diagnosis not present

## 2019-09-17 DIAGNOSIS — F330 Major depressive disorder, recurrent, mild: Secondary | ICD-10-CM | POA: Diagnosis not present

## 2019-10-02 DIAGNOSIS — M5431 Sciatica, right side: Secondary | ICD-10-CM | POA: Diagnosis not present

## 2019-10-02 DIAGNOSIS — M150 Primary generalized (osteo)arthritis: Secondary | ICD-10-CM | POA: Diagnosis not present

## 2019-10-02 DIAGNOSIS — M5432 Sciatica, left side: Secondary | ICD-10-CM | POA: Diagnosis not present

## 2019-10-16 DIAGNOSIS — F330 Major depressive disorder, recurrent, mild: Secondary | ICD-10-CM | POA: Diagnosis not present

## 2019-10-16 DIAGNOSIS — M5432 Sciatica, left side: Secondary | ICD-10-CM | POA: Diagnosis not present

## 2019-10-16 DIAGNOSIS — M5431 Sciatica, right side: Secondary | ICD-10-CM | POA: Diagnosis not present

## 2019-10-16 DIAGNOSIS — M150 Primary generalized (osteo)arthritis: Secondary | ICD-10-CM | POA: Diagnosis not present

## 2019-10-22 DIAGNOSIS — H0510 Unspecified chronic inflammatory disorders of orbit: Secondary | ICD-10-CM | POA: Diagnosis not present

## 2019-10-30 DIAGNOSIS — M545 Low back pain: Secondary | ICD-10-CM | POA: Diagnosis not present

## 2019-10-31 DIAGNOSIS — H0510 Unspecified chronic inflammatory disorders of orbit: Secondary | ICD-10-CM | POA: Diagnosis not present

## 2019-11-18 DIAGNOSIS — M5431 Sciatica, right side: Secondary | ICD-10-CM | POA: Diagnosis not present

## 2019-11-18 DIAGNOSIS — G5601 Carpal tunnel syndrome, right upper limb: Secondary | ICD-10-CM | POA: Diagnosis not present

## 2019-11-18 DIAGNOSIS — M150 Primary generalized (osteo)arthritis: Secondary | ICD-10-CM | POA: Diagnosis not present

## 2019-11-18 DIAGNOSIS — M5432 Sciatica, left side: Secondary | ICD-10-CM | POA: Diagnosis not present

## 2019-12-08 DIAGNOSIS — F330 Major depressive disorder, recurrent, mild: Secondary | ICD-10-CM | POA: Diagnosis not present

## 2019-12-08 DIAGNOSIS — I10 Essential (primary) hypertension: Secondary | ICD-10-CM | POA: Diagnosis not present

## 2019-12-08 DIAGNOSIS — G8929 Other chronic pain: Secondary | ICD-10-CM | POA: Diagnosis not present

## 2019-12-08 DIAGNOSIS — J410 Simple chronic bronchitis: Secondary | ICD-10-CM | POA: Diagnosis not present

## 2019-12-08 DIAGNOSIS — M5432 Sciatica, left side: Secondary | ICD-10-CM | POA: Diagnosis not present

## 2019-12-08 DIAGNOSIS — M5431 Sciatica, right side: Secondary | ICD-10-CM | POA: Diagnosis not present

## 2019-12-08 DIAGNOSIS — R7309 Other abnormal glucose: Secondary | ICD-10-CM | POA: Diagnosis not present

## 2019-12-09 DIAGNOSIS — H5213 Myopia, bilateral: Secondary | ICD-10-CM | POA: Diagnosis not present

## 2019-12-11 ENCOUNTER — Emergency Department: Payer: Medicaid Other

## 2019-12-11 ENCOUNTER — Observation Stay
Admission: EM | Admit: 2019-12-11 | Discharge: 2019-12-12 | Disposition: A | Discharge status: Home / Self Care | Payer: Medicaid Other | Attending: Family Medicine | Admitting: Family Medicine

## 2019-12-11 DIAGNOSIS — Z79899 Other long term (current) drug therapy: Secondary | ICD-10-CM | POA: Diagnosis not present

## 2019-12-11 DIAGNOSIS — Z91040 Latex allergy status: Secondary | ICD-10-CM | POA: Diagnosis not present

## 2019-12-11 DIAGNOSIS — E876 Hypokalemia: Secondary | ICD-10-CM | POA: Diagnosis not present

## 2019-12-11 DIAGNOSIS — F17210 Nicotine dependence, cigarettes, uncomplicated: Secondary | ICD-10-CM | POA: Insufficient documentation

## 2019-12-11 DIAGNOSIS — R000 Tachycardia, unspecified: Secondary | ICD-10-CM | POA: Diagnosis not present

## 2019-12-11 DIAGNOSIS — G8929 Other chronic pain: Secondary | ICD-10-CM | POA: Diagnosis not present

## 2019-12-11 DIAGNOSIS — T887XXA Unspecified adverse effect of drug or medicament, initial encounter: Secondary | ICD-10-CM | POA: Diagnosis not present

## 2019-12-11 DIAGNOSIS — T43014A Poisoning by tricyclic antidepressants, undetermined, initial encounter: Secondary | ICD-10-CM

## 2019-12-11 DIAGNOSIS — R401 Stupor: Secondary | ICD-10-CM | POA: Diagnosis not present

## 2019-12-11 DIAGNOSIS — T50904A Poisoning by unspecified drugs, medicaments and biological substances, undetermined, initial encounter: Secondary | ICD-10-CM | POA: Diagnosis not present

## 2019-12-11 DIAGNOSIS — Z20822 Contact with and (suspected) exposure to covid-19: Secondary | ICD-10-CM | POA: Diagnosis not present

## 2019-12-11 DIAGNOSIS — R410 Disorientation, unspecified: Secondary | ICD-10-CM | POA: Diagnosis present

## 2019-12-11 DIAGNOSIS — T50901A Poisoning by unspecified drugs, medicaments and biological substances, accidental (unintentional), initial encounter: Secondary | ICD-10-CM | POA: Diagnosis not present

## 2019-12-11 DIAGNOSIS — M545 Low back pain, unspecified: Secondary | ICD-10-CM | POA: Insufficient documentation

## 2019-12-11 DIAGNOSIS — R569 Unspecified convulsions: Secondary | ICD-10-CM | POA: Diagnosis not present

## 2019-12-11 DIAGNOSIS — I10 Essential (primary) hypertension: Secondary | ICD-10-CM | POA: Insufficient documentation

## 2019-12-11 DIAGNOSIS — R404 Transient alteration of awareness: Secondary | ICD-10-CM | POA: Diagnosis not present

## 2019-12-11 DIAGNOSIS — J9811 Atelectasis: Secondary | ICD-10-CM | POA: Diagnosis not present

## 2019-12-11 DIAGNOSIS — G9340 Encephalopathy, unspecified: Principal | ICD-10-CM | POA: Insufficient documentation

## 2019-12-11 DIAGNOSIS — M5459 Other low back pain: Secondary | ICD-10-CM | POA: Diagnosis not present

## 2019-12-11 DIAGNOSIS — R4182 Altered mental status, unspecified: Secondary | ICD-10-CM | POA: Diagnosis not present

## 2019-12-11 LAB — CBC WITH DIFFERENTIAL/PLATELET
Abs Immature Granulocytes: 0.02 10*3/uL (ref 0.00–0.07)
Basophils Absolute: 0 10*3/uL (ref 0.0–0.1)
Basophils Relative: 0 %
Eosinophils Absolute: 0 10*3/uL (ref 0.0–0.5)
Eosinophils Relative: 0 %
HCT: 38.8 % (ref 36.0–46.0)
Hemoglobin: 13.3 g/dL (ref 12.0–15.0)
Immature Granulocytes: 0 %
Lymphocytes Relative: 25 %
Lymphs Abs: 2.6 10*3/uL (ref 0.7–4.0)
MCH: 28.5 pg (ref 26.0–34.0)
MCHC: 34.3 g/dL (ref 30.0–36.0)
MCV: 83.3 fL (ref 80.0–100.0)
Monocytes Absolute: 0.6 10*3/uL (ref 0.1–1.0)
Monocytes Relative: 6 %
Neutro Abs: 7 10*3/uL (ref 1.7–7.7)
Neutrophils Relative %: 69 %
Platelets: 431 10*3/uL — ABNORMAL HIGH (ref 150–400)
RBC: 4.66 MIL/uL (ref 3.87–5.11)
RDW: 13.8 % (ref 11.5–15.5)
WBC: 10.3 10*3/uL (ref 4.0–10.5)
nRBC: 0 % (ref 0.0–0.2)

## 2019-12-11 LAB — URINE DRUG SCREEN, QUALITATIVE (ARMC ONLY)
Amphetamines, Ur Screen: NOT DETECTED
Barbiturates, Ur Screen: NOT DETECTED
Benzodiazepine, Ur Scrn: NOT DETECTED
Cannabinoid 50 Ng, Ur ~~LOC~~: NOT DETECTED
Cocaine Metabolite,Ur ~~LOC~~: NOT DETECTED
MDMA (Ecstasy)Ur Screen: NOT DETECTED
Methadone Scn, Ur: NOT DETECTED
Opiate, Ur Screen: NOT DETECTED
Phencyclidine (PCP) Ur S: NOT DETECTED
Tricyclic, Ur Screen: POSITIVE — AB

## 2019-12-11 LAB — COMPREHENSIVE METABOLIC PANEL
ALT: 9 U/L (ref 0–44)
AST: 12 U/L — ABNORMAL LOW (ref 15–41)
Albumin: 4.5 g/dL (ref 3.5–5.0)
Alkaline Phosphatase: 44 U/L (ref 38–126)
Anion gap: 10 (ref 5–15)
BUN: 12 mg/dL (ref 6–20)
CO2: 23 mmol/L (ref 22–32)
Calcium: 9.1 mg/dL (ref 8.9–10.3)
Chloride: 105 mmol/L (ref 98–111)
Creatinine, Ser: 0.78 mg/dL (ref 0.44–1.00)
GFR calc Af Amer: >60 mL/min (ref >60)
GFR calc non Af Amer: >60 mL/min (ref >60)
Glucose, Bld: 103 mg/dL — ABNORMAL HIGH (ref 70–99)
Potassium: 3.3 mmol/L — ABNORMAL LOW (ref 3.5–5.1)
Sodium: 138 mmol/L (ref 135–145)
Total Bilirubin: 0.6 mg/dL (ref 0.3–1.2)
Total Protein: 7.3 g/dL (ref 6.5–8.1)

## 2019-12-11 LAB — URINALYSIS, COMPLETE (UACMP) WITH MICROSCOPIC
Bilirubin Urine: NEGATIVE
Glucose, UA: NEGATIVE mg/dL
Hgb urine dipstick: NEGATIVE
Ketones, ur: NEGATIVE mg/dL
Leukocytes,Ua: NEGATIVE
Nitrite: NEGATIVE
Protein, ur: NEGATIVE mg/dL
Specific Gravity, Urine: 1.003 — ABNORMAL LOW (ref 1.005–1.030)
pH: 8 (ref 5.0–8.0)

## 2019-12-11 LAB — SALICYLATE LEVEL: Salicylate Lvl: <7 mg/dL — ABNORMAL LOW (ref 7.0–30.0)

## 2019-12-11 LAB — TROPONIN I (HIGH SENSITIVITY): Troponin I (High Sensitivity): <2 ng/L (ref <18)

## 2019-12-11 LAB — RESPIRATORY PANEL BY RT PCR (FLU A&B, COVID)
Influenza A by PCR: NEGATIVE
Influenza B by PCR: NEGATIVE
SARS Coronavirus 2 by RT PCR: NEGATIVE

## 2019-12-11 LAB — ACETAMINOPHEN LEVEL: Acetaminophen (Tylenol), Serum: <10 ug/mL — ABNORMAL LOW (ref 10–30)

## 2019-12-11 LAB — LACTIC ACID, PLASMA: Lactic Acid, Venous: 1.1 mmol/L (ref 0.5–1.9)

## 2019-12-11 LAB — MAGNESIUM: Magnesium: 2.1 mg/dL (ref 1.7–2.4)

## 2019-12-11 MED ORDER — LACTATED RINGERS IV BOLUS
1000.0000 mL | Freq: Once | INTRAVENOUS | Status: AC
  Administered 2019-12-11: 1000 mL via INTRAVENOUS

## 2019-12-11 MED ORDER — ALPRAZOLAM 0.5 MG PO TABS: 1.0000 mg | ORAL_TABLET | Freq: Every evening | ORAL | Status: DC | PRN

## 2019-12-11 MED ORDER — CLONIDINE HCL 0.1 MG PO TABS: 0.1000 mg | ORAL_TABLET | Freq: Four times a day (QID) | ORAL | Status: DC | PRN

## 2019-12-11 MED ORDER — ONDANSETRON HCL 4 MG PO TABS: 4.0000 mg | ORAL_TABLET | Freq: Four times a day (QID) | ORAL | Status: DC | PRN

## 2019-12-11 MED ORDER — ONDANSETRON HCL 4 MG/2ML IJ SOLN: 4.0000 mg | Freq: Four times a day (QID) | INTRAMUSCULAR | Status: DC | PRN

## 2019-12-11 MED ORDER — ENOXAPARIN SODIUM 40 MG/0.4ML ~~LOC~~ SOLN
40.0000 mg | SUBCUTANEOUS | Status: DC
  Administered 2019-12-12: 40 mg via SUBCUTANEOUS
  Filled 2019-12-11: qty 0.4

## 2019-12-11 MED ORDER — POTASSIUM CHLORIDE IN NACL 20-0.45 MEQ/L-% IV SOLN
INTRAVENOUS | Status: DC
  Filled 2019-12-11 (×2): qty 1000

## 2019-12-11 MED ORDER — LEVALBUTEROL HCL 0.63 MG/3ML IN NEBU: 0.6300 mg | INHALATION_SOLUTION | Freq: Four times a day (QID) | RESPIRATORY_TRACT | Status: DC | PRN

## 2019-12-11 MED ORDER — GUAIFENESIN ER 600 MG PO TB12
600.0000 mg | ORAL_TABLET | Freq: Two times a day (BID) | ORAL | Status: DC
  Administered 2019-12-12: 600 mg via ORAL
  Filled 2019-12-11: qty 1

## 2019-12-11 NOTE — ED Notes (Signed)
Pt does not participate in exams. When trying to touch pt or examine her, she pulls away and closes eyes.

## 2019-12-11 NOTE — ED Triage Notes (Signed)
Per EMS, husband found pt on floor in bathroom shaking, poss overdose?Marland Kitchen Patient arrives not participating in exam, closes eyes on exam

## 2019-12-11 NOTE — ED Notes (Signed)
Assumed care of pt. Pt opens eyes to RN voice and looks at RN but does not respond to questions or follow commands. Intermittently tearful. Legs constantly moving but no tremors noted. Pt does not appear to be in pain. Pt to CT at this time. Unable to assess for orientation. Side rails up x2, call bell within reach.

## 2019-12-11 NOTE — ED Provider Notes (Signed)
[facility] Emergency Department Provider Note ____________________________________________   First MD Initiated Contact with Patient 12/11/19 1847     (approximate)  I have reviewed the triage vital signs and the nursing notes.  HISTORY  Chief Complaint Altered Mental Status   HPI Paige Carr is a 39 y.o. femalewho presents to the ED for evaluation of altered mental status and shaking.   Chart review indicates hx Bipolar, anxiety and epilepsy.   Patient is unable to provide any history due to her altered mental status and not participating in examination.  This limits history taking.   EMS reports patient was at home with her significant other and her children.  They report that patient's child found her altered, grabbed the father who called 911.  EMS reports that patient is "under a lot of stress."  They did not find any pill bottles or evidence of medication overdose on scene.  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 Authorizing Provider  ALPRAZolam Duanne Moron) 1 MG tablet Take 1 mg by mouth at bedtime as needed for anxiety.    [provider]  ibuprofen (ADVIL) 800 MG tablet Take 1 tablet (800 mg total) by mouth every 8 (eight) hours as needed. 07/14/19   Fisher, Linden Dolin, PA-C  predniSONE (STERAPRED UNI-PAK 21 TAB) 10 MG (21) TBPK  tablet Take 6 pills on day one then decrease by 1 pill each day 07/14/19   Versie Starks, PA-C    Allergies Latex  No family history on file.  Social History Social History   Tobacco Use   Smoking status: Current Every Day Smoker    Packs/day: 0.50    Types: Cigarettes   Smokeless tobacco: Never Used  Vaping Use   Vaping Use: Never used  Substance Use Topics   Alcohol use: No   Drug use: No    Review of Systems  Unable to be accurately assessed due to patient's altered mental status and not being participatory ____________________________________________   PHYSICAL EXAM:  VITAL SIGNS: Vitals:   12/11/19 2000 12/11/19 2127  BP: (!) 155/100 (!) 140/104  Pulse:    Resp: 17 18  Temp:  98.9 F (37.2 C)  SpO2: 100% 100%      Constitutional: Supine in bed with even and unlabored respirations.  Fine rhythmic shaking/rocking to her bilateral lower legs symmetrically.  Bilateral upper extremities are still, with normal muscle tone.  She does not appear to hear me when I stimulate her with loud voice.  Sternal rub elicits direct eye contact with midrange pupils before she promptly closes her eyes again. Patient does not follow any commands.  Eyes: Conjunctivae are normal.  Midrange pupils and PERRL. EOMI. Head: Atraumatic. Nose: No congestion/rhinnorhea. Mouth/Throat: Mucous membranes are moist.  Oropharynx non-erythematous. Neck: No  stridor. No cervical spine tenderness to palpation. Cardiovascular: Tachycardic rate, regular rhythm. Grossly normal heart sounds.  Good peripheral circulation. Respiratory: Normal respiratory effort.  No retractions. Lungs CTAB. Gastrointestinal: Soft , nondistended, nontender to palpation. No abdominal bruits. No CVA tenderness. Musculoskeletal: No lower extremity tenderness nor edema.  No joint effusions. No signs of acute trauma. Neurologic:  . No gross focal neurologic deficits are appreciated. Noted to be moving all 4 extremities  symmetrically while repositioning in the bed and with a rhythmic lower extremity shaking. No ankle clonus or patellar hyperreflexia bilaterally.  Upon reassessment, as indicated below, she has improving mental status and follows commands. Later reassessments find her speaking with a soft voice, following commands and answering questions appropriately.  Continues to have a nonfocal exam. Skin:  Skin is warm, dry and intact. No rash noted. Psychiatric: Unable to be assessed.  ____________________________________________   LABS (all labs ordered are listed, but only abnormal results are displayed)  Labs Reviewed  CBC WITH DIFFERENTIAL/PLATELET - Abnormal; Notable for the following components:      Result Value   Platelets 431 (*)    All other components within normal limits  COMPREHENSIVE METABOLIC PANEL - Abnormal; Notable for the following components:   Potassium 3.3 (*)    Glucose, Bld 103 (*)    AST 12 (*)    All other components within normal limits  URINALYSIS, COMPLETE (UACMP) WITH MICROSCOPIC - Abnormal; Notable for the following components:   Color, Urine YELLOW (*)    APPearance CLOUDY (*)    Specific Gravity, Urine 1.003 (*)    Bacteria, UA MANY (*)    All other components within normal limits  URINE DRUG SCREEN, QUALITATIVE ([facility] ONLY) - Abnormal; Notable for the following components:   Tricyclic, Ur Screen POSITIVE (*)    All other components within normal limits  SALICYLATE LEVEL - Abnormal; Notable for the following components:   Salicylate Lvl <6.4 (*)    All other components within normal limits  ACETAMINOPHEN LEVEL - Abnormal; Notable for the following components:   Acetaminophen (Tylenol), Serum <10 (*)    All other components within normal limits  RESPIRATORY PANEL BY RT PCR (FLU A&B, COVID)  LACTIC ACID, PLASMA  MAGNESIUM  POC URINE PREG, ED  TROPONIN I (HIGH SENSITIVITY)   ____________________________________________  12 Lead EKG  Sinus rhythm, rate  of 126 bpm.  Normal axis.  Normal intervals.  No evidence of acute ischemia. ____________________________________________  RADIOLOGY  ED MD interpretation: CT head and CXR reviewed by me without evidence of acute intracranial pathology or acute cardiopulmonary pathology, respectively.  Official radiology report(s): CT Head Wo Contrast  Result Date: 12/11/2019 CLINICAL DATA:  Mental status change. EXAM: CT HEAD WITHOUT CONTRAST TECHNIQUE: Contiguous axial images were obtained from the base of the skull through the vertex without intravenous contrast. COMPARISON:  Head CT 09/16/2018 FINDINGS: Brain: No intracranial hemorrhage, mass effect, or midline shift. No hydrocephalus. The basilar cisterns are patent. Partially empty sella unchanged from prior exams. No evidence of territorial infarct or acute ischemia. No extra-axial or intracranial fluid collection. Vascular: No hyperdense vessel or unexpected calcification. Skull: Normal. Negative for fracture or focal lesion. Sinuses/Orbits: No acute fracture. Vertebral body heights are maintained. The dens and skull base are intact. Other: None. IMPRESSION: No acute intracranial abnormality. Electronically Signed   By: Keith Rake M.D.   On: 12/11/2019 19:28   DG Chest Portable 1 View  Result Date: 12/11/2019 CLINICAL DATA:  Patient found on floor, possible overdose  EXAM: PORTABLE CHEST 1 VIEW COMPARISON:  Radiograph 08/22/2018 FINDINGS: Hazy and streaky basilar opacities are present with central vascular crowding. There may be some slight airways thickening which could suggest aspiration in the setting of altered mental status and potential overdose though possibly accentuated by atelectatic change. No pneumothorax. No effusion. The cardiomediastinal contours are unremarkable. Bone soft Telemetry leads overlie the chest. IMPRESSION: Streaky and hazy opacities favoring atelectasis. Some questionable airways thickening could reflect aspiration in the  appropriate clinical setting though may be accentuated by the low volumes. Electronically Signed   By: Lovena Le M.D.   On: 12/11/2019 19:08   ____________________________________________   PROCEDURES and INTERVENTIONS  Procedure(s) performed (including Critical Care):  Procedures  Medications  lactated ringers bolus 1,000 mL (1,000 mLs Intravenous New Bag/Given 12/11/19 1950)    ____________________________________________   MDM / ED COURSE  39 year old female presents from home obtunded and tachycardic, possibly due to TCA overdose and without evidence of serious systemic illness, requiring medical observation admission.  Patient presents obtunded and not following commands, she is rhythmically moving both of her legs, but has no clear seizure activity.  She is noted to have frequent volitional movements, repositioning in bed, and looking around the room briefly with sternal rub and so my initial concern was a behavioral or psychiatric pathology.  Especially once her work-up returned benign.  Her blood work showed no evidence of acute pathology of any significance.  She had mild hypokalemia at 3.3 is the most abnormal finding with her blood work.  No lactic acidosis to suggest seizure.  No metabolic acidosis, leukocytosis or evidence systemic illness.  CT head without pathology and CXR without discrete pathology.  Her UDS does return positive for evidence of TCAs, which could possibly cause this syndrome of depressed mental status and tachycardia.  Her EKG shows a sinus tachycardia with a narrow QRS and normal intervals.  She has no evidence of cardiac toxicity of TCAs or other etiology, and therefore magnesium was not provided.  Despite fluid resuscitation, patient remains tachycardic with rates around 110 and a sinus tachycardia.  While her mental status improves, and she starts speaking to [place], she is still lethargic and can hardly keep her eyes open during conversation, despite being  tachycardic to nearly 120.  While she is indicating that she has not taking any medications accidentally or intentionally, she denies suicidality, and she denies obtaining recreational drugs today, I am concerned about an accidental TCA ingestion causing her symptoms.  Regardless, with her continued tachycardia, depressed mental status I do not feel comfortable sending her home and I think she would benefit from medical observation admission.  Patient admitted to hospitalist service for further work-up and management.  Clinical Course as of Dec 10 2229  Thu Dec 11, 2019  1850 Earnestine Mealing, partner, no response. Vm left   [DS]  1902 Reassessed.  Examination slightly improving with improving tachycardia to the 100s.  Continues to rhythmically shake her bilateral legs.  When I say her name, she opens her eyes and looks directly at me with midrange pupils, no nystagmus and maintains eye contact for about 5-10 seconds.  Does not follow commands.   [DS]  1928 Reassessed.  Husband at the bedside.  Patient more readily looking around.  Patient now voluntarily speaking and indicating that she needs to urinate.  Before I can get help from RN, patient starts getting out of the bed independently.  She makes it halfway to the toilet before I stop her.  No falls or trauma.  Patient voids independently on the toilet and provides a urine sample.  She ambulates back to the bed.   [DS]  2000 UDS returning with evidence of TCA.  This is not listed on her medication list within our system.  Return to the bedside and husband is uncertain of patient's home medication names, he does report that she does take something for depression.  He leaves the ED to go home and collect medication bottles to return to [place]. Patient is no longer shaking her lower legs, she is wide awake and attentive.  She visually tracks me as into the room and she appropriately nods "yes" and shakes "no" to questions, though she is mute and refuses to  speak.  She follows commands.  No distress.  Heart rate of 100.   [DS]  2055 Husband returns to the ED with medications. 1 prescription of the patient of Keppra 3 prescriptions from their daughter, 1 for iron supplementation, 1 for BuSpar, 1 for lamotrigine.  Patient more awake, requesting to urinate again.  She reports not knowing what happened today.  She recalls coming home from visiting her mother, going upstairs and taking one of her oxycodone medications and then "out what happened."  Denies overdose of any medications, intentional or accidental.  Denies taking other peoples medications.  Denies any recreational drug use.  Denies suicidality or suicidal plan.   [DS]  2154 Patient persistently tachycardic with depressed mental status.   [DS]    Clinical Course User Index [DS] Vladimir Crofts, MD   ____________________________________________   FINAL CLINICAL IMPRESSION(S) / ED DIAGNOSES  Final diagnoses:  Obtunded  Sinus tachycardia  Hypokalemia  TCA (tricyclic antidepressant) overdose of undetermined intent, initial encounter     ED Discharge Orders    None       Gabe Glace   Note:  This document was prepared using Dragon voice recognition software and may include unintentional dictation errors.   Vladimir Crofts, MD 12/11/19 [phone]

## 2019-12-11 NOTE — Progress Notes (Signed)
PHARMACIST - PHYSICIAN COMMUNICATION  CONCERNING:  Enoxaparin (Lovenox) for DVT Prophylaxis    RECOMMENDATION: Patient was prescribed enoxaparin 40mg  q24 hours for VTE prophylaxis.   Filed Weights   12/11/19 1844  Weight: 72 kg (158 lb 11.7 oz)    Body mass index is 28.12 kg/m.  Estimated Creatinine Clearance: 91.5 mL/min (by C-G formula based on SCr of 0.78 mg/dL).   Patient is candidate for enoxaparin 40 mg every 24 hours based on CrCl > 50ml/min or Weight  > 45kg  DESCRIPTION: Pharmacy has adjusted enoxaparin dose per [facility] policy.  Patient is now receiving enoxaparin 40 mg every 24 hours    Benita Gutter 12/11/2019 11:52 PM

## 2019-12-11 NOTE — ED Notes (Signed)
Pt husband at bedside. Pt now answering yes or no questions at this time. Denies cp or sob, unsure of head strike.

## 2019-12-11 NOTE — H&P (Addendum)
History and Physical    Paige Carr RCV:[id number] DOB: [birth year] DOA: 12/11/2019  PCP: Patient, No Pcp Per  Patient coming from: home I have personally briefly reviewed patient's old medical records in [place]  Chief Complaint: change in mental status  HPI: Paige Carr is a 39 y.o. female with medical history significant of  Anxiety, bipolar 1 d/o , epilepsy, CLBP followed by pain clinic, hx of uterine cancer, who presents to ed after ems was called by husband, after she was found sitting on bathroom floor shaking and although awake  Was not responsive. On initial arrival to ed patient was noted to be confused but after some time patient was noted to be less lethargic and returned to her baseline mental status. Per patient she felt in her usual health other that recent exacerbation of her chronic back pain that she was recently given prednisone taper 5 days prior. Patient states the only other medication she takes is ativan and Keppra. She denies taking non prescribed medications, she also denies SI. She notes that she is complaint with her medications and does not miss doses. She also noted her last sz was one mo ago. ON ros she does note mild rhinorrhea and sore throat, but denies sob/fever/chills/n/v/d/abdominal pain/ or dysuria. Also she does not difficulty passing her urine of late. She also states that currently she has excessive fatigue and feels generally weak.    ED Course:  bp 158/109, hr 127(101), rr 26, sat 100%  Labs: Wbc 10.3/hbg 13.3, plt431 Lactate :1.1 Na 138, K3.3, mag 2.1 Respiratory panel neg for flu/rsv/covid ZWC:HENID tachycardia Repeat ua pending Urine drug: TCA CT head:negative  POE:UMPNTIR and hazy opacities favoring atelectasis. Some questionable airways thickening could reflect aspiration in the appropriate clinical setting though may be accentuated by the low volumes. tx LR x 1L Review of Systems: As per HPI otherwise 10 point review of systems  negative.   Past Medical History:  Diagnosis Date  . Anxiety   . Bipolar 1 disorder with moderate mania ([place])   . Carpal tunnel syndrome   . Epilepsy ([place])   . Papilledema   . Uterine cancer [facility])     Past Surgical History:  Procedure Laterality Date  . ABDOMINAL HYSTERECTOMY    . CHOLECYSTECTOMY    . HAND SURGERY    . HERNIA REPAIR       reports that she has been smoking cigarettes. She has been smoking about 0.50 packs per day. She has never used smokeless tobacco. She reports that she does not drink alcohol and does not use drugs.  Allergies  Allergen Reactions  . Latex Hives    No family history on file.  Prior to Admission medications   Medication Sig Start Date End Date Taking? Authorizing Provider  ALPRAZolam Duanne Moron) 1 MG tablet Take 1 mg by mouth at bedtime as needed for anxiety.    [provider]  ibuprofen (ADVIL) 800 MG tablet Take 1 tablet (800 mg total) by mouth every 8 (eight) hours as needed. 07/14/19   Fisher, Linden Dolin, PA-C  predniSONE (STERAPRED UNI-PAK 21 TAB) 10 MG (21) TBPK tablet Take 6 pills on day one then decrease by 1 pill each day 07/14/19   Versie Starks, PA-C    Physical Exam: Vitals:   12/11/19 1900 12/11/19 2000 12/11/19 2127 12/11/19 2130  BP: (!) 152/106 (!) 155/100 (!) 140/104 (!) 137/100  Pulse: (!) 114     Resp: (!) 22 17 18 18   Temp:  98.9 F (37.2 C)   TempSrc:   Oral   SpO2: 98% 100% 100% 100%  Weight:      Height:        Constitutional: NAD, calm, comfortable Vitals:   12/11/19 1900 12/11/19 2000 12/11/19 2127 12/11/19 2130  BP: (!) 152/106 (!) 155/100 (!) 140/104 (!) 137/100  Pulse: (!) 114     Resp: (!) 22 17 18 18   Temp:   98.9 F (37.2 C)   TempSrc:   Oral   SpO2: 98% 100% 100% 100%  Weight:      Height:       Eyes: pupils are dilated and slow to react to light, lids and conjunctivae normal ENMT: Mucous membranes are moist. Posterior pharynx clear of any exudate or lesions.Normal dentition.  Neck:  normal, supple, no masses, no thyromegaly Respiratory: clear to auscultation bilaterally, no wheezing, no crackles. Normal respiratory effort. No accessory muscle use.  Cardiovascular: Regular rhythm,tachycardic, no murmurs / rubs / gallops. No extremity edema. 2+ pedal pulses. No carotid bruits.  Abdomen: no tenderness, no masses palpated. Fullness over the bladder, No hepatosplenomegaly. Bowel sounds positive.  Musculoskeletal: no clubbing / cyanosis. No joint deformity upper and lower extremities. Good ROM, no contractures. Normal muscle tone.  Skin: no rashes, lesions, ulcers. No induration Neurologic: CN 2-12 grossly intact. Sensation intact, DTR normal. Strength 5/5 in all 4.  Psychiatric: Normal judgment and insight. Alert and oriented x 3. Normal mood.    Labs on Admission: I have personally reviewed following labs and imaging studies  CBC: Recent Labs  Lab 12/11/19 1852  WBC 10.3  NEUTROABS 7.0  HGB 13.3  HCT 38.8  MCV 83.3  PLT 361*   Basic Metabolic Panel: Recent Labs  Lab 12/11/19 1852  NA 138  K 3.3*  CL 105  CO2 23  GLUCOSE 103*  BUN 12  CREATININE 0.78  CALCIUM 9.1  MG 2.1   GFR: Estimated Creatinine Clearance: 91.5 mL/min (by C-G formula based on SCr of 0.78 mg/dL). Liver Function Tests: Recent Labs  Lab 12/11/19 1852  AST 12*  ALT 9  ALKPHOS 44  BILITOT 0.6  PROT 7.3  ALBUMIN 4.5   No results for input(s): LIPASE, AMYLASE in the last 168 hours. No results for input(s): AMMONIA in the last 168 hours. Coagulation Profile: No results for input(s): INR, PROTIME in the last 168 hours. Cardiac Enzymes: No results for input(s): CKTOTAL, CKMB, CKMBINDEX, TROPONINI in the last 168 hours. BNP (last 3 results) No results for input(s): PROBNP in the last 8760 hours. HbA1C: No results for input(s): HGBA1C in the last 72 hours. CBG: No results for input(s): GLUCAP in the last 168 hours. Lipid Profile: No results for input(s): CHOL, HDL, LDLCALC, TRIG,  CHOLHDL, LDLDIRECT in the last 72 hours. Thyroid Function Tests: No results for input(s): TSH, T4TOTAL, FREET4, T3FREE, THYROIDAB in the last 72 hours. Anemia Panel: No results for input(s): VITAMINB12, FOLATE, FERRITIN, TIBC, IRON, RETICCTPCT in the last 72 hours. Urine analysis:    Component Value Date/Time   COLORURINE YELLOW (A) 12/11/2019 1926   APPEARANCEUR CLOUDY (A) 12/11/2019 1926   APPEARANCEUR Clear 07/03/2014 1433   LABSPEC 1.003 (L) 12/11/2019 1926   LABSPEC 1.005 07/03/2014 1433   PHURINE 8.0 12/11/2019 1926   GLUCOSEU NEGATIVE 12/11/2019 1926   GLUCOSEU Negative 07/03/2014 1433   HGBUR NEGATIVE 12/11/2019 1926   BILIRUBINUR NEGATIVE 12/11/2019 1926   BILIRUBINUR Negative 07/03/2014 [place] 12/11/2019 1926   PROTEINUR NEGATIVE 12/11/2019 1926  NITRITE NEGATIVE 12/11/2019 1926   LEUKOCYTESUR NEGATIVE 12/11/2019 1926   LEUKOCYTESUR Negative 07/03/2014 1433    Radiological Exams on Admission: CT Head Wo Contrast  Result Date: 12/11/2019 CLINICAL DATA:  Mental status change. EXAM: CT HEAD WITHOUT CONTRAST TECHNIQUE: Contiguous axial images were obtained from the base of the skull through the vertex without intravenous contrast. COMPARISON:  Head CT 09/16/2018 FINDINGS: Brain: No intracranial hemorrhage, mass effect, or midline shift. No hydrocephalus. The basilar cisterns are patent. Partially empty sella unchanged from prior exams. No evidence of territorial infarct or acute ischemia. No extra-axial or intracranial fluid collection. Vascular: No hyperdense vessel or unexpected calcification. Skull: Normal. Negative for fracture or focal lesion. Sinuses/Orbits: No acute fracture. Vertebral body heights are maintained. The dens and skull base are intact. Other: None. IMPRESSION: No acute intracranial abnormality. Electronically Signed   By: Keith Rake M.D.   On: 12/11/2019 19:28   DG Chest Portable 1 View  Result Date: 12/11/2019 CLINICAL DATA:   Patient found on floor, possible overdose EXAM: PORTABLE CHEST 1 VIEW COMPARISON:  Radiograph 08/22/2018 FINDINGS: Hazy and streaky basilar opacities are present with central vascular crowding. There may be some slight airways thickening which could suggest aspiration in the setting of altered mental status and potential overdose though possibly accentuated by atelectatic change. No pneumothorax. No effusion. The cardiomediastinal contours are unremarkable. Bone soft Telemetry leads overlie the chest. IMPRESSION: Streaky and hazy opacities favoring atelectasis. Some questionable airways thickening could reflect aspiration in the appropriate clinical setting though may be accentuated by the low volumes. Electronically Signed   By: Lovena Le M.D.   On: 12/11/2019 19:08    EKG: Independently reviewed. Sinus tachycardia   Assessment/Plan  Encephalopathy nos  -due to  TCA overdose  -EKG notes normal interval but initially sinus tachycardia with improvement s/p ivfs  -CT head negative/ neuro exam nonfocal  - place on sz precaution  -neuro checks  -ivfs  Uncontrolled HTN  - improved w/o treatment -patient w/o hx of HTN  - due to acute stress -continue to monitor    Thrombocytosis -monitor counts /decreasing with ivfs  Hx of SZ  -resume keppra 1000 mg  x1 now  -med rec needs update in am to get patient accurate doses -last sz was  22m o ago  -check magnesium   Anxiety -on prn ativan, continue home medications    bipolar 1 d/o  -not currently on treatment    Chronic Low back pain  -supportive care    Tobacco abuse -nicotine patch -encourage cessation  FEN hypokalemia Electrolytes stable replete prn    DVT prophylaxis:  Heparin  Code Status: FULL Family Communication: n/a Disposition Plan: patient  expected to be admitted less than 2 midnights Consults called: n/a Admission status: obs  Clance Boll MD [facility]   If 7PM-7AM, please contact  night-coverage [email or web address] Password [email or web address]  12/11/2019, 11:01 PM

## 2019-12-11 NOTE — ED Notes (Signed)
Pt vocalizing answers to RN in small but clear phrases. Breathing is regular and unlabored. Denies pain, states she "is tired." NS bolus infusing

## 2019-12-12 ENCOUNTER — Encounter: Payer: Self-pay | Admitting: Internal Medicine

## 2019-12-12 DIAGNOSIS — G9340 Encephalopathy, unspecified: Secondary | ICD-10-CM | POA: Diagnosis not present

## 2019-12-12 DIAGNOSIS — T43014A Poisoning by tricyclic antidepressants, undetermined, initial encounter: Secondary | ICD-10-CM

## 2019-12-12 LAB — AMMONIA: Ammonia: 17 umol/L (ref 9–35)

## 2019-12-12 LAB — CBC
HCT: 40 % (ref 36.0–46.0)
HCT: 40.3 % (ref 36.0–46.0)
Hemoglobin: 13.4 g/dL (ref 12.0–15.0)
Hemoglobin: 13.7 g/dL (ref 12.0–15.0)
MCH: 28.2 pg (ref 26.0–34.0)
MCH: 28.8 pg (ref 26.0–34.0)
MCHC: 33.3 g/dL (ref 30.0–36.0)
MCHC: 34.3 g/dL (ref 30.0–36.0)
MCV: 84.2 fL (ref 80.0–100.0)
MCV: 84.8 fL (ref 80.0–100.0)
Platelets: 398 10*3/uL (ref 150–400)
Platelets: 406 10*3/uL — ABNORMAL HIGH (ref 150–400)
RBC: 4.75 MIL/uL (ref 3.87–5.11)
RBC: 4.75 MIL/uL (ref 3.87–5.11)
RDW: 13.8 % (ref 11.5–15.5)
RDW: 13.8 % (ref 11.5–15.5)
WBC: 10.8 10*3/uL — ABNORMAL HIGH (ref 4.0–10.5)
WBC: 11.4 10*3/uL — ABNORMAL HIGH (ref 4.0–10.5)
nRBC: 0 % (ref 0.0–0.2)
nRBC: 0 % (ref 0.0–0.2)

## 2019-12-12 LAB — COMPREHENSIVE METABOLIC PANEL
ALT: 7 U/L (ref 0–44)
AST: 10 U/L — ABNORMAL LOW (ref 15–41)
Albumin: 4.2 g/dL (ref 3.5–5.0)
Alkaline Phosphatase: 43 U/L (ref 38–126)
Anion gap: 6 (ref 5–15)
BUN: 9 mg/dL (ref 6–20)
CO2: 25 mmol/L (ref 22–32)
Calcium: 8.7 mg/dL — ABNORMAL LOW (ref 8.9–10.3)
Chloride: 108 mmol/L (ref 98–111)
Creatinine, Ser: 0.62 mg/dL (ref 0.44–1.00)
GFR calc Af Amer: >60 mL/min (ref >60)
GFR calc non Af Amer: >60 mL/min (ref >60)
Glucose, Bld: 107 mg/dL — ABNORMAL HIGH (ref 70–99)
Potassium: 4.2 mmol/L (ref 3.5–5.1)
Sodium: 139 mmol/L (ref 135–145)
Total Bilirubin: 0.6 mg/dL (ref 0.3–1.2)
Total Protein: 6.9 g/dL (ref 6.5–8.1)

## 2019-12-12 LAB — POTASSIUM: Potassium: 3.6 mmol/L (ref 3.5–5.1)

## 2019-12-12 LAB — MAGNESIUM: Magnesium: 2.1 mg/dL (ref 1.7–2.4)

## 2019-12-12 LAB — CREATININE, SERUM
Creatinine, Ser: 0.6 mg/dL (ref 0.44–1.00)
GFR calc Af Amer: >60 mL/min (ref >60)
GFR calc non Af Amer: >60 mL/min (ref >60)

## 2019-12-12 LAB — TSH: TSH: 0.769 u[IU]/mL (ref 0.350–4.500)

## 2019-12-12 LAB — CK: Total CK: 43 U/L (ref 38–234)

## 2019-12-12 LAB — HIV ANTIBODY (ROUTINE TESTING W REFLEX): HIV Screen 4th Generation wRfx: NONREACTIVE

## 2019-12-12 MED ORDER — NICOTINE 21 MG/24HR TD PT24
21.0000 mg | MEDICATED_PATCH | Freq: Every day | TRANSDERMAL | Status: DC
  Administered 2019-12-12: 21 mg via TRANSDERMAL
  Filled 2019-12-12: qty 1

## 2019-12-12 MED ORDER — TRAMADOL HCL 50 MG PO TABS: 50.0000 mg | ORAL_TABLET | Freq: Four times a day (QID) | ORAL | Status: DC | PRN

## 2019-12-12 MED ORDER — POTASSIUM CHLORIDE 10 MEQ/100ML IV SOLN
10.0000 meq | INTRAVENOUS | Status: AC
  Administered 2019-12-12 (×4): 10 meq via INTRAVENOUS
  Filled 2019-12-12 (×4): qty 100

## 2019-12-12 MED ORDER — NICOTINE 21 MG/24HR TD PT24: 21.0000 mg | MEDICATED_PATCH | Freq: Every day | TRANSDERMAL | 0 refills | Status: AC

## 2019-12-12 NOTE — ED Notes (Signed)
Report received from [facility]. Patient care assumed. Patient/RN introduction complete. Will continue to monitor. Pt resting comfortably with eyes closed, no distress noted. IVF infusing well, site intact.

## 2019-12-12 NOTE — ED Notes (Signed)
Pt alert but drowsy, ambulated to toilet with steady gait. Denies needs or concerns at this time. AO x4. Pt talking in full sentences with regular and unlabored breathing. Medicated per [facility]

## 2019-12-12 NOTE — ED Notes (Signed)
No change in condition, pt resting comfortably with eyes closed.

## 2019-12-12 NOTE — ED Notes (Signed)
Order to discharges patient. Mother at bedside to take patient home. Pt ate 50% of breakfast. Pt ambulates safely.  Pt alert and oriented X4, cooperative, RR even and unlabored, color WNL. Pt in NAD.

## 2019-12-12 NOTE — ED Notes (Signed)
Pt given breakfast. Mother at bedside to visit.

## 2019-12-12 NOTE — Discharge Summary (Signed)
Physician Discharge Summary [facility]    Patient: Paige Carr                   Admit date: 12/11/2019   DOB: 11/20/1980             Discharge date:12/12/2019/9:53 AM YBW:[id number]                          PCP: Patient, No Pcp Per  Disposition: HOME  Recommendations for Outpatient Follow-up:   . Follow up: ASAP with primary neurologist  Discharge Condition: Stable   Code Status:   Code Status: Full Code  Diet recommendation: Regular healthy diet   Discharge Diagnoses:    Active Problems:   Encephalopathy   History of Present Illness/ Hospital Course Kathleen Argue Summary:    HPI: Paige Carr is a 39 y.o. female with medical history significant of  Anxiety, bipolar 1 d/o , epilepsy, CLBP followed by pain clinic, hx of uterine cancer, who presents to ed after ems was called by husband, after she was found sitting on bathroom floor shaking and although awake  Was not responsive. On initial arrival to ed patient was noted to be confused but after some time patient was noted to be less lethargic and returned to her baseline mental status. Per patient she felt in her usual health other that recent exacerbation of her chronic back pain that she was recently given prednisone taper 5 days prior. Patient states the only other medication she takes is ativan and Keppra. She denies taking non prescribed medications, she also denies SI. She notes that she is complaint with her medications and does not miss doses. She also noted her last sz was one mo ago. ON ros she does note mild rhinorrhea and sore throat, but denies sob/fever/chills/n/v/d/abdominal pain/ or dysuria. Also she does not difficulty passing her urine of late. She also states that currently she has excessive fatigue and feels generally weak.    ED Course:  bp 158/109, hr 127(101), rr 26, sat 100%  Labs: Wbc 10.3/hbg 13.3, plt431 Lactate :1.1 Na 138, K3.3, mag 2.1 Respiratory panel ALL Neg for  flu/rsv/covid JGO:TLXBW tachycardia Repeat UA negative  Urine drug: TCA CT head:negative  IOM:BTDHRCB and hazy opacities favoring atelectasis. Some questionable airways thickening could reflect aspiration in the appropriate clinical setting though may be accentuated by the low volumes. tx LR x 1L   Hospital course:    Encephalopathy nos  -due to  TCA overdose  -Mental status back to baseline, awake alert oriented x4 -EKG notes normal interval but initially sinus tachycardia with improvement s/p ivfs  -CT head negative/ neuro exam nonfocal  - place on sz precaution -no seizure activity observed -neuro checks -no new focal neurological findings -Status post IV fluid hydration, Keppra 1 g IV  Patient is back to baseline requesting to be discharged home Instructed to taper down on her pain medications, continue her seizure medication, follow-up with her neurologist ASAP.  Uncontrolled HTN  - improved w/o treatment -patient w/o hx of HTN  Stable   Thrombocytosis -For monitoring -Likely reactive-resolved  Hx of SZ  -resume keppra 1000 mg  x1 now  -Home medication updated, patient is continue her home medication of Keppra 5 mg p.o. twice daily -last sz was  20m o ago    Anxiety -on prn ativan, continue home medications    bipolar 1 d/o  -not currently on treatment    Chronic Low back  pain  -supportive care  -Patient takes heavy narcotics oxycodone 5 mg, Percocet 10/325, Ultram Stating having chronic back pain, patient advised to taper down possible discontinue meds   Tobacco abuse -nicotine patch -encourage cessation NicoDerm patch prescribed  FEN hypokalemia Resolved    Code Status: FULL Family Communication:  Mother at bedside Disposition Plan:  Home    Nutritional status:          Discharge Instructions:   Discharge Instructions    Activity as tolerated - No restrictions   Complete by: As directed    Call MD for:  difficulty  breathing, headache or visual disturbances   Complete by: As directed    Call MD for:  persistant dizziness or light-headedness   Complete by: As directed    Diet - low sodium heart healthy   Complete by: As directed    Discharge instructions   Complete by: As directed    Patient instructed to cut down on her pain medications.  Recommended to be compliant with her seizure medications She is to follow-up with her primary neurologist as soon as possible in the next 24 hours to modify her medications, If needed by her primary neurologist.   Increase activity slowly   Complete by: As directed        Medication List    STOP taking these medications   amitriptyline 25 MG tablet Commonly known as: ELAVIL   azithromycin 250 MG tablet Commonly known as: ZITHROMAX   oxyCODONE 5 MG immediate release tablet Commonly known as: Oxy IR/ROXICODONE   predniSONE 10 MG (21) Tbpk tablet Commonly known as: STERAPRED UNI-PAK 21 TAB     TAKE these medications   acetaminophen 500 MG tablet Commonly known as: TYLENOL Take 1,000 mg by mouth 3 (three) times daily.   ALPRAZolam 1 MG tablet Commonly known as: XANAX Take 1 mg by mouth at bedtime as needed for anxiety.   ibuprofen 800 MG tablet Commonly known as: ADVIL Take 1 tablet (800 mg total) by mouth every 8 (eight) hours as needed.   levETIRAcetam 500 MG tablet Commonly known as: KEPPRA Take 500 mg by mouth every 12 (twelve) hours.   nicotine 21 mg/24hr patch Commonly known as: NICODERM CQ - dosed in mg/24 hours Place 1 patch (21 mg total) onto the skin daily. Start taking on: December 13, 2019   oxyCODONE-acetaminophen 10-325 MG tablet Commonly known as: PERCOCET Take 1 tablet by mouth every 8 (eight) hours as needed.   ProAir HFA 108 (90 Base) MCG/ACT inhaler Generic drug: albuterol Inhale 2 puffs into the lungs every 6 (six) hours as needed.   traMADol 50 MG tablet Commonly known as: ULTRAM Take 50 mg by mouth 2 (two) times  daily as needed.       Allergies  Allergen Reactions  . Latex Hives     Procedures /Studies:   CT Head Wo Contrast  Result Date: 12/11/2019 CLINICAL DATA:  Mental status change. EXAM: CT HEAD WITHOUT CONTRAST TECHNIQUE: Contiguous axial images were obtained from the base of the skull through the vertex without intravenous contrast. COMPARISON:  Head CT 09/16/2018 FINDINGS: Brain: No intracranial hemorrhage, mass effect, or midline shift. No hydrocephalus. The basilar cisterns are patent. Partially empty sella unchanged from prior exams. No evidence of territorial infarct or acute ischemia. No extra-axial or intracranial fluid collection. Vascular: No hyperdense vessel or unexpected calcification. Skull: Normal. Negative for fracture or focal lesion. Sinuses/Orbits: No acute fracture. Vertebral body heights are maintained. The dens and skull base  are intact. Other: None. IMPRESSION: No acute intracranial abnormality. Electronically Signed   By: Keith Rake M.D.   On: 12/11/2019 19:28   DG Chest Portable 1 View  Result Date: 12/11/2019 CLINICAL DATA:  Patient found on floor, possible overdose EXAM: PORTABLE CHEST 1 VIEW COMPARISON:  Radiograph 08/22/2018 FINDINGS: Hazy and streaky basilar opacities are present with central vascular crowding. There may be some slight airways thickening which could suggest aspiration in the setting of altered mental status and potential overdose though possibly accentuated by atelectatic change. No pneumothorax. No effusion. The cardiomediastinal contours are unremarkable. Bone soft Telemetry leads overlie the chest. IMPRESSION: Streaky and hazy opacities favoring atelectasis. Some questionable airways thickening could reflect aspiration in the appropriate clinical setting though may be accentuated by the low volumes. Electronically Signed   By: Lovena Le M.D.   On: 12/11/2019 19:08     Subjective:   Patient was seen and examined 12/12/2019, 9:53  AM Patient stable today. No acute distress.  No issues overnight Stable for discharge.  Discharge Exam:    Vitals:   12/12/19 0400 12/12/19 0600 12/12/19 0700 12/12/19 0807  BP: (!) 143/103 (!) 151/103 (!) 154/103 (!) 126/93  Pulse: 87 94 62 (!) 115  Resp: 17 19 19 11   Temp:      TempSrc:      SpO2: 96% 100% (!) 76% 99%  Weight:      Height:        General: Pt lying comfortably in bed & appears in no obvious distress. Cardiovascular: S1 & S2 heard, RRR, S1/S2 +. No murmurs, rubs, gallops or clicks. No JVD or pedal edema. Respiratory: Clear to auscultation without wheezing, rhonchi or crackles. No increased work of breathing. Abdominal:  Non-distended, non-tender & soft. No organomegaly or masses appreciated. Normal bowel sounds heard. CNS: Alert and oriented. No focal deficits. Extremities: no edema, no cyanosis    The results of significant diagnostics from this hospitalization (including imaging, microbiology, ancillary and laboratory) are listed below for reference.      Microbiology:   Recent Results (from the past 240 hour(s))  Respiratory Panel by RT PCR (Flu A&B, Covid) - Nasopharyngeal Swab     Status: None   Collection Time: 12/11/19  6:52 PM   Specimen: Nasopharyngeal Swab  Result Value Ref Range Status   SARS Coronavirus 2 by RT PCR NEGATIVE NEGATIVE Final    Comment: (NOTE) SARS-CoV-2 target nucleic acids are NOT DETECTED.  The SARS-CoV-2 RNA is generally detectable in upper respiratoy specimens during the acute phase of infection. The lowest concentration of SARS-CoV-2 viral copies this assay can detect is 131 copies/mL. A negative result does not preclude SARS-Cov-2 infection and should not be used as the sole basis for treatment or other patient management decisions. A negative result may occur with  improper specimen collection/handling, submission of specimen other than nasopharyngeal swab, presence of viral mutation(s) within the areas targeted by  this assay, and inadequate number of viral copies (<131 copies/mL). A negative result must be combined with clinical observations, patient history, and epidemiological information. The expected result is Negative.  Fact Sheet for Patients:  PinkCheek.be  Fact Sheet for Healthcare Providers:  GravelBags.it  This test is no t yet approved or cleared by the [place] FDA and  has been authorized for detection and/or diagnosis of SARS-CoV-2 by FDA under an Emergency Use Authorization (EUA). This EUA will remain  in effect (meaning this test can be used) for the duration of the COVID-19 declaration  under Section 564(b)(1) of the Act, 21 U.S.C. section 360bbb-3(b)(1), unless the authorization is terminated or revoked sooner.     Influenza A by PCR NEGATIVE NEGATIVE Final   Influenza B by PCR NEGATIVE NEGATIVE Final    Comment: (NOTE) The Xpert Xpress SARS-CoV-2/FLU/RSV assay is intended as an aid in  the diagnosis of influenza from Nasopharyngeal swab specimens and  should not be used as a sole basis for treatment. Nasal washings and  aspirates are unacceptable for Xpert Xpress SARS-CoV-2/FLU/RSV  testing.  Fact Sheet for Patients: PinkCheek.be  Fact Sheet for Healthcare Providers: GravelBags.it  This test is not yet approved or cleared by the [place] FDA and  has been authorized for detection and/or diagnosis of SARS-CoV-2 by  FDA under an Emergency Use Authorization (EUA). This EUA will remain  in effect (meaning this test can be used) for the duration of the  Covid-19 declaration under Section 564(b)(1) of the Act, 21  U.S.C. section 360bbb-3(b)(1), unless the authorization is  terminated or revoked. Performed at [facility], [address] [place], [place] [address]      Labs:   CBC: Recent Labs  Lab 12/11/19 1852 12/12/19 0035  12/12/19 0446  WBC 10.3 10.8* 11.4*  NEUTROABS 7.0  --   --   HGB 13.3 13.7 13.4  HCT 38.8 40.0 40.3  MCV 83.3 84.2 84.8  PLT 431* 406* 361   Basic Metabolic Panel: Recent Labs  Lab 12/11/19 1852 12/12/19 0035 12/12/19 0446  NA 138  --  139  K 3.3* 3.6 4.2  CL 105  --  108  CO2 23  --  25  GLUCOSE 103*  --  107*  BUN 12  --  9  CREATININE 0.78 0.60 0.62  CALCIUM 9.1  --  8.7*  MG 2.1 2.1  --    Liver Function Tests: Recent Labs  Lab 12/11/19 1852 12/12/19 0446  AST 12* 10*  ALT 9 7  ALKPHOS 44 43  BILITOT 0.6 0.6  PROT 7.3 6.9  ALBUMIN 4.5 4.2   BNP (last 3 results) No results for input(s): BNP in the last 8760 hours. Cardiac Enzymes: Recent Labs  Lab 12/12/19 0035  CKTOTAL 43   CBG: No results for input(s): GLUCAP in the last 168 hours. Hgb A1c No results for input(s): HGBA1C in the last 72 hours. Lipid Profile No results for input(s): CHOL, HDL, LDLCALC, TRIG, CHOLHDL, LDLDIRECT in the last 72 hours. Thyroid function studies Recent Labs    12/12/19 0035  TSH 0.769   Anemia work up No results for input(s): VITAMINB12, FOLATE, FERRITIN, TIBC, IRON, RETICCTPCT in the last 72 hours. Urinalysis    Component Value Date/Time   COLORURINE YELLOW (A) 12/11/2019 1926   APPEARANCEUR CLOUDY (A) 12/11/2019 1926   APPEARANCEUR Clear 07/03/2014 1433   LABSPEC 1.003 (L) 12/11/2019 1926   LABSPEC 1.005 07/03/2014 1433   PHURINE 8.0 12/11/2019 1926   GLUCOSEU NEGATIVE 12/11/2019 1926   GLUCOSEU Negative 07/03/2014 1433   HGBUR NEGATIVE 12/11/2019 1926   BILIRUBINUR NEGATIVE 12/11/2019 1926   BILIRUBINUR Negative 07/03/2014 1433   KETONESUR NEGATIVE 12/11/2019 1926   PROTEINUR NEGATIVE 12/11/2019 1926   NITRITE NEGATIVE 12/11/2019 1926   LEUKOCYTESUR NEGATIVE 12/11/2019 1926   LEUKOCYTESUR Negative 07/03/2014 1433         Time coordinating discharge: Over 45 minutes  SIGNED: Deatra James, MD, FACP, FHM. [facility],  Please use  [email or web address] to Page If 7PM-7AM, please contact night-coverage [email or web address] [email or web address] [facility] 12/12/2019,  9:53 AM

## 2019-12-18 DIAGNOSIS — M5459 Other low back pain: Secondary | ICD-10-CM | POA: Diagnosis not present

## 2019-12-18 DIAGNOSIS — G5602 Carpal tunnel syndrome, left upper limb: Secondary | ICD-10-CM | POA: Diagnosis not present

## 2019-12-18 DIAGNOSIS — G5601 Carpal tunnel syndrome, right upper limb: Secondary | ICD-10-CM | POA: Diagnosis not present

## 2019-12-18 DIAGNOSIS — G8929 Other chronic pain: Secondary | ICD-10-CM | POA: Diagnosis not present

## 2019-12-30 DIAGNOSIS — Z20822 Contact with and (suspected) exposure to covid-19: Secondary | ICD-10-CM | POA: Diagnosis not present

## 2020-01-19 DIAGNOSIS — M5432 Sciatica, left side: Secondary | ICD-10-CM | POA: Diagnosis not present

## 2020-01-19 DIAGNOSIS — M150 Primary generalized (osteo)arthritis: Secondary | ICD-10-CM | POA: Diagnosis not present

## 2020-01-19 DIAGNOSIS — I10 Essential (primary) hypertension: Secondary | ICD-10-CM | POA: Diagnosis not present

## 2020-01-19 DIAGNOSIS — M5431 Sciatica, right side: Secondary | ICD-10-CM | POA: Diagnosis not present

## 2020-02-18 DIAGNOSIS — M5431 Sciatica, right side: Secondary | ICD-10-CM | POA: Diagnosis not present

## 2020-02-18 DIAGNOSIS — M5432 Sciatica, left side: Secondary | ICD-10-CM | POA: Diagnosis not present

## 2020-02-18 DIAGNOSIS — M150 Primary generalized (osteo)arthritis: Secondary | ICD-10-CM | POA: Diagnosis not present

## 2020-02-18 DIAGNOSIS — I10 Essential (primary) hypertension: Secondary | ICD-10-CM | POA: Diagnosis not present

## 2020-04-21 DIAGNOSIS — J441 Chronic obstructive pulmonary disease with (acute) exacerbation: Secondary | ICD-10-CM | POA: Diagnosis not present

## 2020-04-21 DIAGNOSIS — M5432 Sciatica, left side: Secondary | ICD-10-CM | POA: Diagnosis not present

## 2020-04-21 DIAGNOSIS — M5431 Sciatica, right side: Secondary | ICD-10-CM | POA: Diagnosis not present

## 2020-04-21 DIAGNOSIS — F339 Major depressive disorder, recurrent, unspecified: Secondary | ICD-10-CM | POA: Diagnosis not present

## 2020-05-24 DIAGNOSIS — I10 Essential (primary) hypertension: Secondary | ICD-10-CM | POA: Diagnosis not present

## 2020-05-24 DIAGNOSIS — F330 Major depressive disorder, recurrent, mild: Secondary | ICD-10-CM | POA: Diagnosis not present

## 2020-05-24 DIAGNOSIS — M150 Primary generalized (osteo)arthritis: Secondary | ICD-10-CM | POA: Diagnosis not present

## 2020-06-21 DIAGNOSIS — I10 Essential (primary) hypertension: Secondary | ICD-10-CM | POA: Diagnosis not present

## 2020-06-21 DIAGNOSIS — F330 Major depressive disorder, recurrent, mild: Secondary | ICD-10-CM | POA: Diagnosis not present

## 2020-06-21 DIAGNOSIS — M150 Primary generalized (osteo)arthritis: Secondary | ICD-10-CM | POA: Diagnosis not present

## 2020-06-21 DIAGNOSIS — M5412 Radiculopathy, cervical region: Secondary | ICD-10-CM | POA: Diagnosis not present

## 2020-06-24 DIAGNOSIS — R7309 Other abnormal glucose: Secondary | ICD-10-CM | POA: Diagnosis not present

## 2020-06-24 DIAGNOSIS — I10 Essential (primary) hypertension: Secondary | ICD-10-CM | POA: Diagnosis not present

## 2020-06-24 DIAGNOSIS — G8929 Other chronic pain: Secondary | ICD-10-CM | POA: Diagnosis not present

## 2020-07-21 ENCOUNTER — Telehealth: Payer: Self-pay

## 2020-07-21 DIAGNOSIS — G5601 Carpal tunnel syndrome, right upper limb: Secondary | ICD-10-CM | POA: Diagnosis not present

## 2020-07-21 DIAGNOSIS — M5431 Sciatica, right side: Secondary | ICD-10-CM | POA: Diagnosis not present

## 2020-07-21 DIAGNOSIS — M5432 Sciatica, left side: Secondary | ICD-10-CM | POA: Diagnosis not present

## 2020-07-21 DIAGNOSIS — I10 Essential (primary) hypertension: Secondary | ICD-10-CM | POA: Diagnosis not present

## 2020-07-21 NOTE — Telephone Encounter (Signed)
Pt calling; hasn't been seen since 2018; was told if no problems we didn't need to see her; at 4am she started to have sharp pain in her left side; still has ovaries.  Pt sounds like the pain takes her breath away.  [phone]  Adv pt if in that much pain she needs to go to the ED; they can give her pain med and we don't have any; they, at this point, can help her more that we could.  Pt has someone to drive her.

## 2020-10-18 DIAGNOSIS — I10 Essential (primary) hypertension: Secondary | ICD-10-CM | POA: Diagnosis not present

## 2020-10-18 DIAGNOSIS — F330 Major depressive disorder, recurrent, mild: Secondary | ICD-10-CM | POA: Diagnosis not present

## 2020-10-18 DIAGNOSIS — M150 Primary generalized (osteo)arthritis: Secondary | ICD-10-CM | POA: Diagnosis not present

## 2021-01-04 DIAGNOSIS — M150 Primary generalized (osteo)arthritis: Secondary | ICD-10-CM | POA: Diagnosis not present

## 2021-01-04 DIAGNOSIS — I10 Essential (primary) hypertension: Secondary | ICD-10-CM | POA: Diagnosis not present

## 2021-01-04 DIAGNOSIS — F330 Major depressive disorder, recurrent, mild: Secondary | ICD-10-CM | POA: Diagnosis not present

## 2021-03-11 DIAGNOSIS — M5416 Radiculopathy, lumbar region: Secondary | ICD-10-CM | POA: Diagnosis not present

## 2021-03-11 DIAGNOSIS — F330 Major depressive disorder, recurrent, mild: Secondary | ICD-10-CM | POA: Diagnosis not present

## 2021-03-11 DIAGNOSIS — I10 Essential (primary) hypertension: Secondary | ICD-10-CM | POA: Diagnosis not present

## 2021-03-11 DIAGNOSIS — M150 Primary generalized (osteo)arthritis: Secondary | ICD-10-CM | POA: Diagnosis not present

## 2021-03-22 ENCOUNTER — Emergency Department
Admission: EM | Admit: 2021-03-22 | Discharge: 2021-03-22 | Disposition: A | Discharge status: Home / Self Care | Payer: Medicaid Other | Attending: Emergency Medicine | Admitting: Emergency Medicine

## 2021-03-22 ENCOUNTER — Emergency Department: Payer: Medicaid Other

## 2021-03-22 DIAGNOSIS — T887XXA Unspecified adverse effect of drug or medicament, initial encounter: Secondary | ICD-10-CM | POA: Diagnosis not present

## 2021-03-22 DIAGNOSIS — R000 Tachycardia, unspecified: Secondary | ICD-10-CM | POA: Diagnosis not present

## 2021-03-22 DIAGNOSIS — I469 Cardiac arrest, cause unspecified: Secondary | ICD-10-CM | POA: Diagnosis not present

## 2021-03-22 DIAGNOSIS — T40601A Poisoning by unspecified narcotics, accidental (unintentional), initial encounter: Secondary | ICD-10-CM

## 2021-03-22 DIAGNOSIS — Z8542 Personal history of malignant neoplasm of other parts of uterus: Secondary | ICD-10-CM | POA: Diagnosis not present

## 2021-03-22 DIAGNOSIS — R0602 Shortness of breath: Secondary | ICD-10-CM | POA: Diagnosis not present

## 2021-03-22 DIAGNOSIS — R52 Pain, unspecified: Secondary | ICD-10-CM | POA: Diagnosis not present

## 2021-03-22 DIAGNOSIS — T50904A Poisoning by unspecified drugs, medicaments and biological substances, undetermined, initial encounter: Secondary | ICD-10-CM | POA: Diagnosis not present

## 2021-03-22 DIAGNOSIS — T402X1A Poisoning by other opioids, accidental (unintentional), initial encounter: Secondary | ICD-10-CM | POA: Diagnosis not present

## 2021-03-22 LAB — COMPREHENSIVE METABOLIC PANEL
ALT: 87 U/L — ABNORMAL HIGH (ref 0–44)
AST: 56 U/L — ABNORMAL HIGH (ref 15–41)
Albumin: 3.8 g/dL (ref 3.5–5.0)
Alkaline Phosphatase: 52 U/L (ref 38–126)
Anion gap: 7 (ref 5–15)
BUN: 8 mg/dL (ref 6–20)
CO2: 24 mmol/L (ref 22–32)
Calcium: 8.4 mg/dL — ABNORMAL LOW (ref 8.9–10.3)
Chloride: 103 mmol/L (ref 98–111)
Creatinine, Ser: 0.75 mg/dL (ref 0.44–1.00)
GFR, Estimated: >60 mL/min (ref >60)
Glucose, Bld: 177 mg/dL — ABNORMAL HIGH (ref 70–99)
Potassium: 3.2 mmol/L — ABNORMAL LOW (ref 3.5–5.1)
Sodium: 134 mmol/L — ABNORMAL LOW (ref 135–145)
Total Bilirubin: 0.3 mg/dL (ref 0.3–1.2)
Total Protein: 6.8 g/dL (ref 6.5–8.1)

## 2021-03-22 LAB — CBC WITH DIFFERENTIAL/PLATELET
Abs Immature Granulocytes: 0.12 10*3/uL — ABNORMAL HIGH (ref 0.00–0.07)
Basophils Absolute: 0.1 10*3/uL (ref 0.0–0.1)
Basophils Relative: 0 %
Eosinophils Absolute: 0.4 10*3/uL (ref 0.0–0.5)
Eosinophils Relative: 3 %
HCT: 40.1 % (ref 36.0–46.0)
Hemoglobin: 13.2 g/dL (ref 12.0–15.0)
Immature Granulocytes: 1 %
Lymphocytes Relative: 23 %
Lymphs Abs: 3 10*3/uL (ref 0.7–4.0)
MCH: 28.1 pg (ref 26.0–34.0)
MCHC: 32.9 g/dL (ref 30.0–36.0)
MCV: 85.5 fL (ref 80.0–100.0)
Monocytes Absolute: 0.7 10*3/uL (ref 0.1–1.0)
Monocytes Relative: 5 %
Neutro Abs: 9.2 10*3/uL — ABNORMAL HIGH (ref 1.7–7.7)
Neutrophils Relative %: 68 %
Platelets: 409 10*3/uL — ABNORMAL HIGH (ref 150–400)
RBC: 4.69 MIL/uL (ref 3.87–5.11)
RDW: 13.3 % (ref 11.5–15.5)
WBC: 13.4 10*3/uL — ABNORMAL HIGH (ref 4.0–10.5)
nRBC: 0 % (ref 0.0–0.2)

## 2021-03-22 LAB — HCG, QUANTITATIVE, PREGNANCY: hCG, Beta Chain, Quant, S: <1 m[IU]/mL (ref <5)

## 2021-03-22 LAB — CBG MONITORING, ED: Glucose-Capillary: 147 mg/dL — ABNORMAL HIGH (ref 70–99)

## 2021-03-22 LAB — ETHANOL: Alcohol, Ethyl (B): <10 mg/dL (ref <10)

## 2021-03-22 LAB — ACETAMINOPHEN LEVEL: Acetaminophen (Tylenol), Serum: <10 ug/mL — ABNORMAL LOW (ref 10–30)

## 2021-03-22 LAB — SALICYLATE LEVEL: Salicylate Lvl: <7 mg/dL — ABNORMAL LOW (ref 7.0–30.0)

## 2021-03-22 MED ORDER — NALOXONE HCL 2 MG/2ML IJ SOSY
0.4000 mg | PREFILLED_SYRINGE | Freq: Once | INTRAMUSCULAR | Status: AC
  Administered 2021-03-22: 0.4 mg via INTRAVENOUS

## 2021-03-22 MED ORDER — ONDANSETRON HCL 4 MG/2ML IJ SOLN
4.0000 mg | Freq: Once | INTRAMUSCULAR | Status: AC
  Administered 2021-03-22: 4 mg via INTRAVENOUS

## 2021-03-22 MED ORDER — SODIUM CHLORIDE 0.9 % IV BOLUS
1000.0000 mL | Freq: Once | INTRAVENOUS | Status: AC
  Administered 2021-03-22: 1000 mL via INTRAVENOUS

## 2021-03-22 NOTE — ED Triage Notes (Signed)
Found by husband ,  cpr by husband, pt very drowsy, fd gave 6 nasal narcan , pt family states she takes xanax and oxy, pt mumbled cocaine usage

## 2021-03-22 NOTE — Discharge Instructions (Addendum)
Please return to emergency department if you have difficulty breathing.  Please use the incentive spirometer to help open up your lungs.  Your liver function tests were just mildly elevated.  Please follow-up with your primary care provider to have these checked to ensure that they are not rising.

## 2021-03-22 NOTE — ED Notes (Signed)
Incentive spirometer provided; patient verbalized use/ function

## 2021-03-22 NOTE — ED Provider Notes (Signed)
[facility] Provider Note    Event Date/Time   First MD Initiated Contact with Patient 03/22/21 1743     (approximate)   History   Drug Overdose (Found by husband ,  cpr by husband, pt very drowsy, fd gave 6 nasal narcan , pt family states she takes xanax and oxy )   HPI  Paige Carr is a 41 y.o. female past medical history of bipolar disorder, epilepsy who presents after presumed opiate overdose.  Independent history is obtained from the patient's husband who notes that when he came home today she was in her normal state of health.  He was in another room when he heard some gurgling.  When he found her she was not breathing and looked very pale.  He initiated CPR, said she was breathing again and was going in and out.  EMS gave her Narcan and she seemed to improve.    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 --      Excl. in [place]? --     Most recent vital signs: Vitals:   03/22/21 1900 03/22/21 1945  BP: 129/82   Pulse: (!) 116 (!) 107  Resp: 13 11  Temp:    SpO2: 97% 94%      General: Somnolent, no acute distress CV:  Good peripheral perfusion.  Tachycardic Resp:  Respirations are depressed Abd:  No distention.  Neuro:             Awake, Alert, Oriented x 3  Other:  Patient does open her eyes to sternal rub, does not follow commands, no verbal response   ED Results / Procedures / Treatments  Labs (all labs ordered are listed, but only abnormal results are displayed) Labs Reviewed  COMPREHENSIVE METABOLIC PANEL - Abnormal; Notable for the following components:      Result Value   Sodium 134 (*)    Potassium 3.2 (*)    Glucose, Bld 177 (*)    Calcium 8.4 (*)    AST 56 (*)    ALT 87 (*)    All other components within normal limits  CBC WITH DIFFERENTIAL/PLATELET - Abnormal; Notable for the following components:   WBC 13.4 (*)    Platelets 409 (*)    Neutro Abs 9.2 (*)  Abs Immature Granulocytes 0.12 (*)    All other components within normal limits  ACETAMINOPHEN LEVEL - Abnormal; Notable for the following components:   Acetaminophen (Tylenol), Serum <10 (*)    All other components within normal limits  SALICYLATE LEVEL - Abnormal; Notable for the following components:   Salicylate Lvl <3.6 (*)    All other components within normal limits  CBG MONITORING, ED - Abnormal; Notable for the following components:   Glucose-Capillary 147 (*)    All other components within normal limits  ETHANOL  HCG, QUANTITATIVE, PREGNANCY  URINE DRUG SCREEN, QUALITATIVE ([facility] ONLY)     EKG  EKG interpreted by myself, sinus tachycardia, normal axis, normal intervals no acute changes   RADIOLOGY Chest x-ray interpreted by myself, possible atelectasis on the left, reviewed radiology report, similar to prior x-ray no acute process   PROCEDURES:  Critical Care performed: Yes, see critical care procedure note(s)  .Critical Care Performed by: Rada Hay, MD Authorized by: Rada Hay, MD   Critical care provider statement:    Critical  care time (minutes):  30   Critical care was time spent personally by me on the following activities:  Development of treatment plan with patient or surrogate, discussions with consultants, evaluation of patient's response to treatment, examination of patient, ordering and review of laboratory studies, ordering and review of radiographic studies, ordering and performing treatments and interventions, pulse oximetry, re-evaluation of patient's condition and review of old charts .1-3 Lead EKG Interpretation Performed by: Rada Hay, MD Authorized by: Rada Hay, MD     Interpretation: abnormal     ECG rate assessment: tachycardic     Rhythm: sinus tachycardia     Ectopy: none     Conduction: normal    The patient is on the cardiac monitor to evaluate for evidence of arrhythmia and/or significant heart rate changes.   MEDICATIONS ORDERED IN ED: Medications  naloxone [facility]) injection 0.4 mg (0.4 mg Intravenous Given 03/22/21 1740)  sodium chloride 0.9 % bolus 1,000 mL (0 mLs Intravenous Stopped 03/22/21 1832)  ondansetron (ZOFRAN) injection 4 mg (4 mg Intravenous Given 03/22/21 1753)     IMPRESSION / MDM / ASSESSMENT AND PLAN / ED COURSE  I reviewed the triage vital signs and the nursing notes.                              Differential diagnosis includes, but is not limited to, intentional versus unintentional opiate overdose, benzodiazepine overdose, aspiration  Patient is a 41 year old female with history of bipolar disorder who apparently takes chronic benzos and Percocet who presents with altered mental status.  Husband tells me that she was gurgling and barely breathing and he had to initiate CPR.  She did have increased respirations with Narcan by EMS.  On arrival to the ED patient's status is depressed.  She does open her eyes to sternal rub, pulls are pinpoint, she is breathing about 7-8 times per minute and is satting 93% on 4 L nasal cannula.  She was given 0.4 mg of  IV Narcan and had quick improvement in her respirations and mental status although she did vomit and become more tachycardic.  Patient is able to tell me that she took cocaine.  I question whether it was laced with fentanyl.  Also question whether now she is in opiate withdrawal from the chronic Percocets that she takes.  Patient still rather somnolent so unable  to get a clear history about the intent.  We will obtain a Tylenol level and some basic labs.  EKG has normal intervals.   Patient's labs are reassuring, AST and ALT just mildly elevated, her Tylenol level is normal.  Patient much more awake on my reevaluation.  She denies any other ingestions including overdose of her Percocet, Tylenol, she is asking to be discharged.  Her saturations are normal off of oxygen and patient is tolerating p.o.  Denies any intent to what happened today.  Counseled on the importance of avoiding illicit drugs.  Incentive spirometer provided.  Given she is back to baseline with normalizing vital signs, still mildly tachycardic but much improved I do feel that she is appropriate for discharge.     FINAL CLINICAL IMPRESSION(S) / ED DIAGNOSES   Final diagnoses:  Opiate overdose, accidental or unintentional, initial encounter [facility])     Rx / DC Orders   ED Discharge Orders     None        Note:  This document was prepared using Dragon voice recognition software and may include unintentional dictation errors.   Rada Hay, MD 03/22/21 2007

## 2021-05-12 DIAGNOSIS — R7309 Other abnormal glucose: Secondary | ICD-10-CM | POA: Diagnosis not present

## 2021-05-12 DIAGNOSIS — Z79891 Long term (current) use of opiate analgesic: Secondary | ICD-10-CM | POA: Diagnosis not present

## 2021-05-12 DIAGNOSIS — G8929 Other chronic pain: Secondary | ICD-10-CM | POA: Diagnosis not present

## 2021-05-12 DIAGNOSIS — I10 Essential (primary) hypertension: Secondary | ICD-10-CM | POA: Diagnosis not present

## 2021-09-13 IMAGING — CT CT HEAD W/O CM
3 of 6 series · 16 of 47 positions shown, 19 images · non-contrast
Comparison: Head CT 09/16/2018

CLINICAL DATA: Mental status change.

EXAM:
CT HEAD WITHOUT CONTRAST
TECHNIQUE: Contiguous axial images were obtained from the base of the skull
through the vertex without intravenous contrast.

[Series 2: head wo · axial · 0.43mm/px · z∈[-132,-7]mm · 11 of 31 slices shown, 14 images]
[im 3/31  brain]
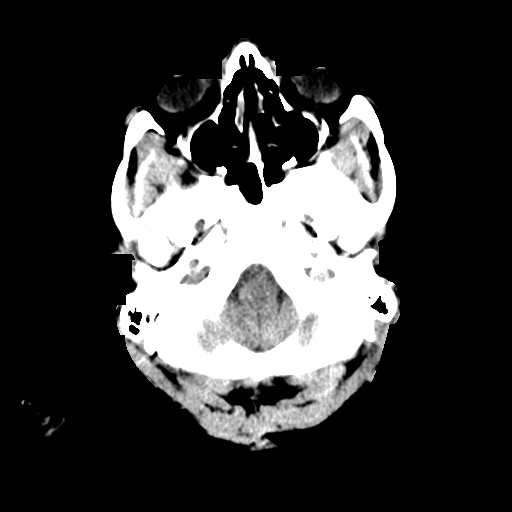
[im 3/31  bone]
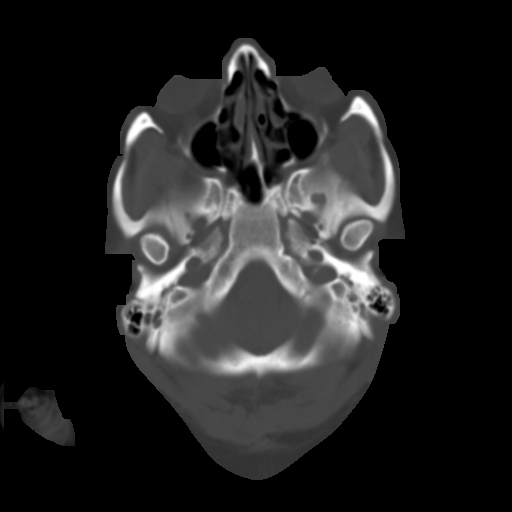
[im 5/31  brain]
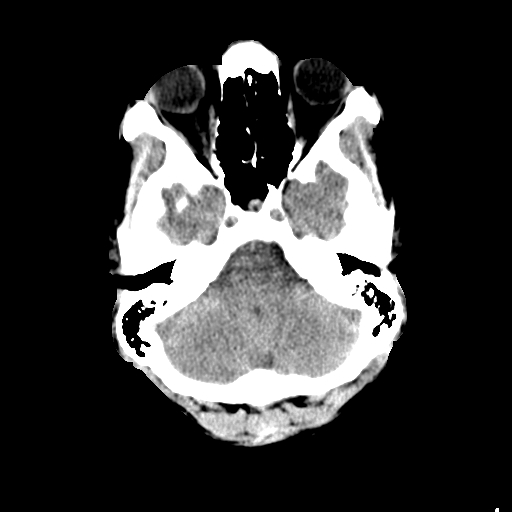
[im 7/31  brain]
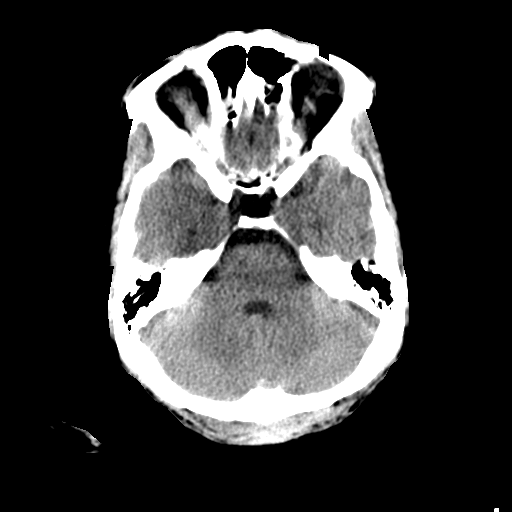
[im 11/31  brain]
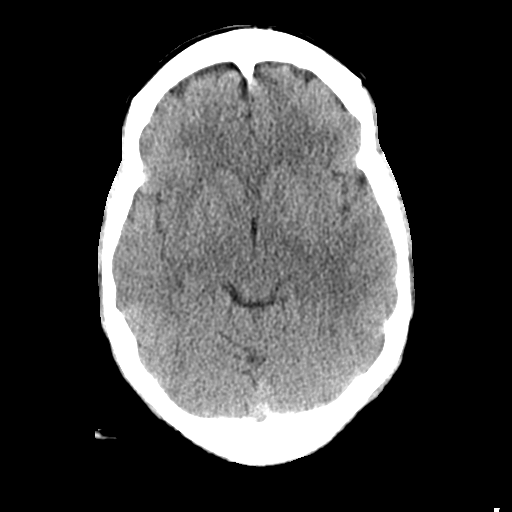
[im 13/31  brain]
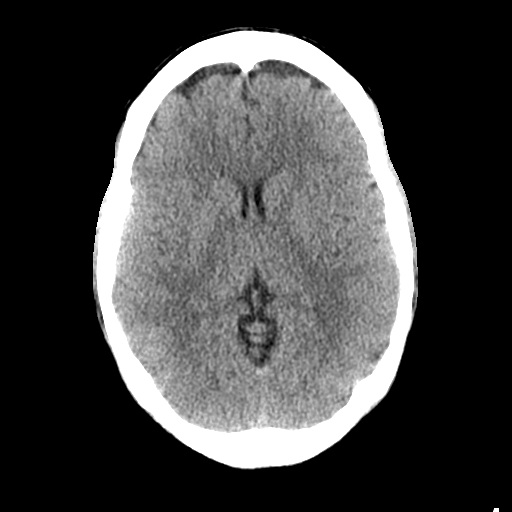
[im 13/31  bone]
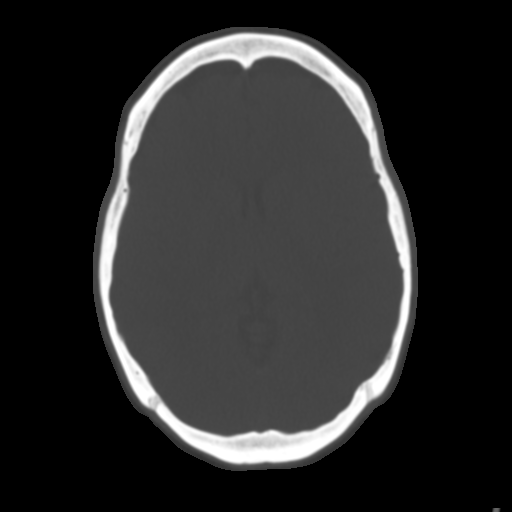
[im 16/31  brain]
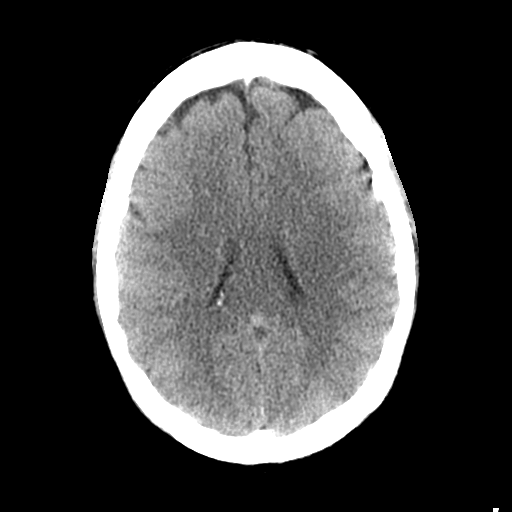
[im 18/31  brain]
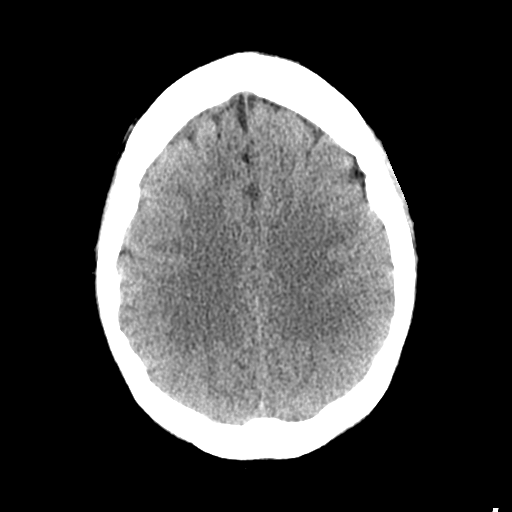
[im 20/31  brain]
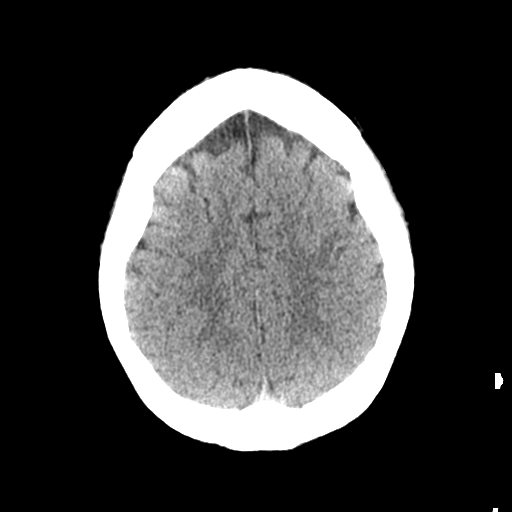
[im 24/31  brain]
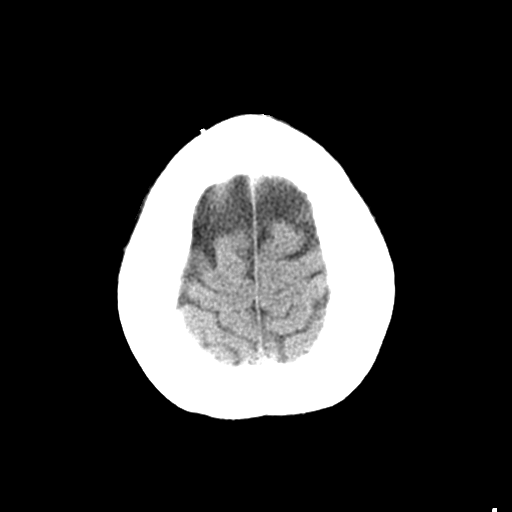
[im 24/31  bone]
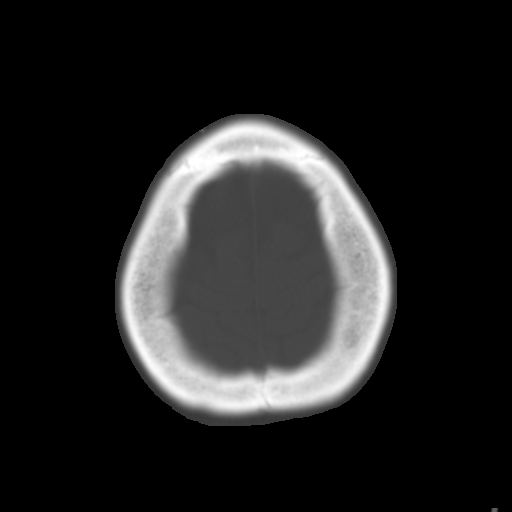
[im 26/31  brain]
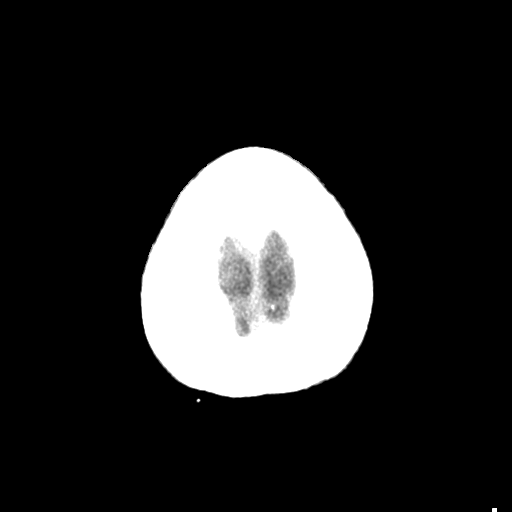
[im 28/31  brain]
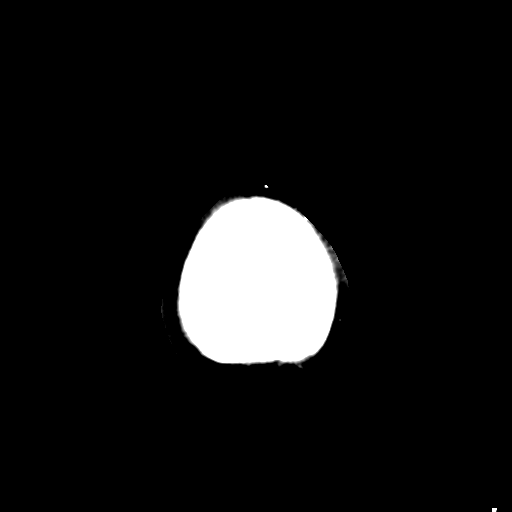

[Series 4: coronal soft tissue · coronal · 0.29mm/px · 3 of 66 slices shown]
[im 17/66  brain]
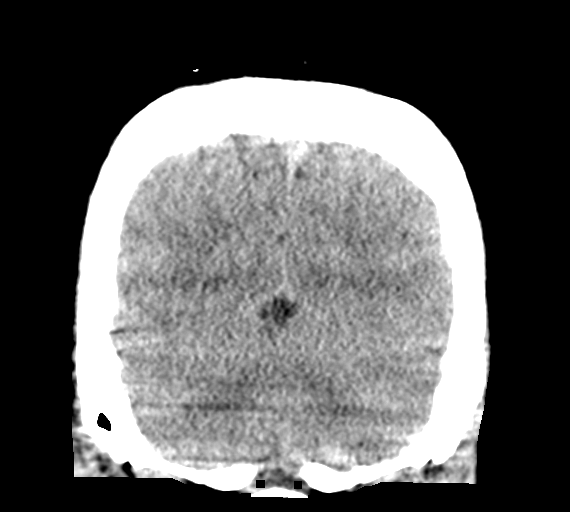
[im 33/66  brain]
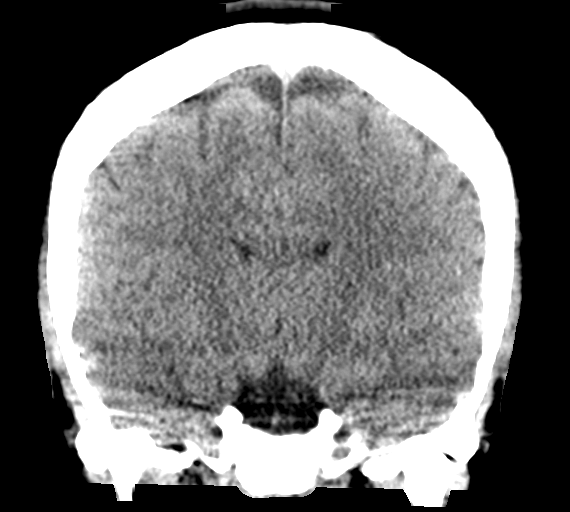
[im 49/66  brain]
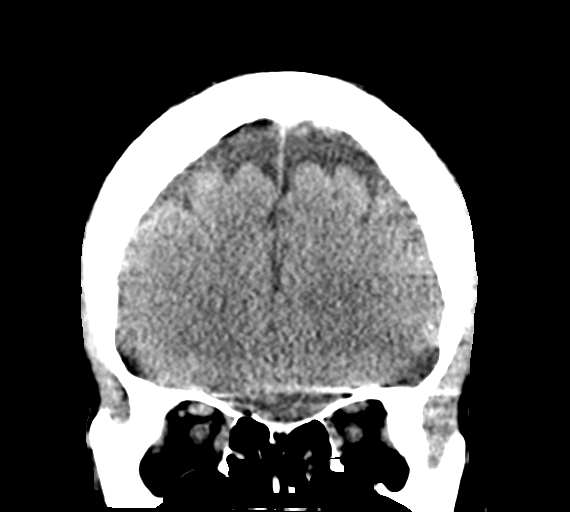

[Series 9: sagittal soft tissue · sagittal · 0.33mm/px · 2 of 51 slices shown]
[im 17/51  brain]
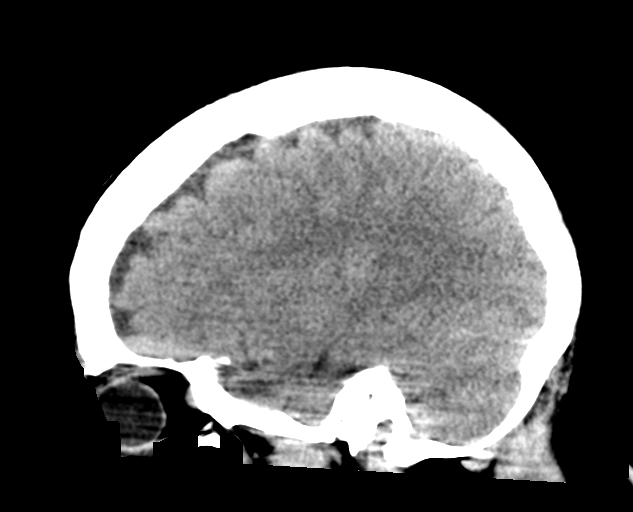
[im 34/51  brain]
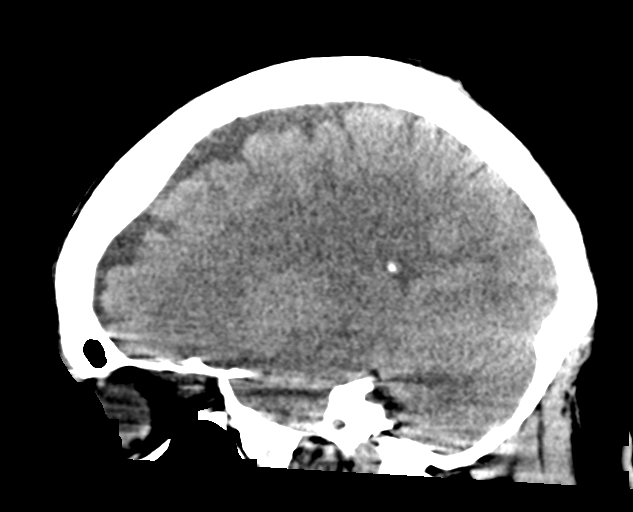

[16 of 47 positions shown; findings below may reference images not displayed]

FINDINGS: Brain: No intracranial hemorrhage, mass effect, or midline shift. No
hydrocephalus. The basilar cisterns are patent. Partially empty
sella unchanged from prior exams. No evidence of territorial infarct
or acute ischemia. No extra-axial or intracranial fluid collection.

Vascular: No hyperdense vessel or unexpected calcification.

Skull: Normal. Negative for fracture or focal lesion.

Sinuses/Orbits: No acute fracture. Vertebral body heights are
maintained. The dens and skull base are intact.

Other: None.
IMPRESSION: No acute intracranial abnormality.
